# Patient Record
Sex: Male | Born: 1985 | Race: Black or African American | Hispanic: No | Marital: Single | Smoking: Current every day smoker
Health system: Southern US, Community
[De-identification: ages and names within clinical notes are randomized; demographics above are authoritative.]

## PROBLEM LIST (undated history)

## (undated) DIAGNOSIS — G5600 Carpal tunnel syndrome, unspecified upper limb: Secondary | ICD-10-CM

## (undated) DIAGNOSIS — I319 Disease of pericardium, unspecified: Secondary | ICD-10-CM

## (undated) HISTORY — PX: TONSILLECTOMY: SUR1361

## (undated) HISTORY — PX: HERNIA REPAIR: SHX51

---
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
## 2007-03-30 ENCOUNTER — Emergency Department (HOSPITAL_COMMUNITY): Admission: EM | Admit: 2007-03-30 | Discharge: 2007-03-30 | Payer: Self-pay | Admitting: Emergency Medicine

## 2007-06-08 ENCOUNTER — Emergency Department (HOSPITAL_COMMUNITY): Admission: EM | Admit: 2007-06-08 | Discharge: 2007-06-08 | Payer: Self-pay | Admitting: Emergency Medicine

## 2008-05-12 ENCOUNTER — Emergency Department (HOSPITAL_COMMUNITY): Admission: EM | Admit: 2008-05-12 | Discharge: 2008-05-12 | Payer: Self-pay | Admitting: Emergency Medicine

## 2010-01-27 ENCOUNTER — Emergency Department (HOSPITAL_COMMUNITY): Admission: EM | Admit: 2010-01-27 | Discharge: 2010-01-27 | Payer: Self-pay | Admitting: Emergency Medicine

## 2011-01-23 ENCOUNTER — Emergency Department (HOSPITAL_COMMUNITY)
Admission: EM | Admit: 2011-01-23 | Discharge: 2011-01-23 | Disposition: A | Discharge status: Home / Self Care | Payer: Self-pay | Attending: Emergency Medicine | Admitting: Emergency Medicine

## 2011-01-23 DIAGNOSIS — M65839 Other synovitis and tenosynovitis, unspecified forearm: Secondary | ICD-10-CM | POA: Insufficient documentation

## 2011-01-23 DIAGNOSIS — M79609 Pain in unspecified limb: Secondary | ICD-10-CM | POA: Insufficient documentation

## 2011-01-23 DIAGNOSIS — M25539 Pain in unspecified wrist: Secondary | ICD-10-CM | POA: Insufficient documentation

## 2011-03-26 NOTE — Op Note (Signed)
Maurice Williamson, Maurice Williamson                           ACCOUNT NO.:  [id number]   MEDICAL RECORD NO.:  [id number]                   PATIENT TYPE:  AMB   LOCATION:  DAY                                  FACILITY:  [facility]   PHYSICIAN:  Jerolyn Shin C. Katrinka Blazing, M.D.                DATE OF BIRTH:  November 05, 1986   DATE OF PROCEDURE:  11/12/2003  DATE OF DISCHARGE:                                 OPERATIVE REPORT   PREOPERATIVE DIAGNOSIS:  Umbilical hernia.   POSTOPERATIVE DIAGNOSIS:  Umbilical hernia.   OPERATION/PROCEDURE:  Umbilical hernia repair.   SURGEON:  Dirk Dress. Katrinka Blazing, M.D.   DESCRIPTION OF PROCEDURE:  Under general anesthesia, the patient's abdomen  was prepped and draped in the sterile field.  Infraumbilical curvilinear  incision was made.  The proboscis was dissected circumferentially using  mosquito clamps.  It was excised from the overlying skin.  The excess sac  was excised down to the fascia.  The fascia was closed with running 0  Prolene transversely.  The skin was sutured to the fascia with 2-0 Biosyn.  The incision was closed with 4-0 Dexon.  Local infiltration with 0.0.5%  Marcaine with epinephrine was carried out.  Dressing was applied.  The  patient was awakened from anesthesia uneventfully, transferred to a bed and  taken to the post anesthesia care unit for further monitoring.      ___________________________________________                                            Dirk Dress. Katrinka Blazing, M.D.   LCS/MEDQ  D:  11/12/2003  T:  11/12/2003  Job:  [id number]

## 2011-03-26 NOTE — H&P (Signed)
Maurice Williamson, Maurice Williamson                           ACCOUNT NO.:  [id number]   MEDICAL RECORD NO.:  [id number]                   PATIENT TYPE:  AMB   LOCATION:  DAY                                  FACILITY:  [facility]   PHYSICIAN:  Jerolyn Shin C. Katrinka Blazing, M.D.                DATE OF BIRTH:  21-Aug-1986   DATE OF ADMISSION:  DATE OF DISCHARGE:                                HISTORY & PHYSICAL   HISTORY OF PRESENT ILLNESS:  [age]-year-old male with history of umbilical  hernia since birth.  It has gradually increased in size.  It has also become  more symptomatic.  The patient was referred for treatment back in August.  He was advised to have it repaired over the holidays.  He is scheduled to  have it excised and repaired.   PAST HISTORY:  He has no major medical illness.  He has not had surgery  except for tonsil removal.   MEDICATIONS:  He is not on any chronic medications.   FAMILY HISTORY:  Family history positive for hypertension, heart disease,  diabetes, stroke, depression, cancer.   ALLERGIES:  No known drug allergies.   SOCIAL HISTORY:  He is a high [employer].  He smokes cigarettes.  He  does not drink alcohol or use drugs.   PHYSICAL EXAMINATION:  VITAL SIGNS:  On exam, blood pressure 100/60, pulse  74, respirations 16, weight 164 pounds.  HEENT:  Unremarkable.  NECK:  Neck is supple.  CHEST:  Chest clear to auscultation.  HEART:  Regular rhythm without murmur or gallop.  ABDOMEN:  Abdomen is soft and nontender.  There is a 2-cm umbilical defect  with a significant __________  .  EXTREMITIES:  No cyanosis, clubbing or edema.  NEUROLOGIC:  No focal, motor, sensory or cerebellar deficits.   IMPRESSION:  Umbilical hernia.   PLAN:  Umbilical hernia repair.     ___________________________________________                                         Dirk Dress. Katrinka Blazing, M.D.   LCS/MEDQ  D:  11/11/2003  T:  11/12/2003  Job:  [id number]

## 2011-04-12 ENCOUNTER — Emergency Department (HOSPITAL_COMMUNITY): Payer: Self-pay

## 2011-04-12 ENCOUNTER — Emergency Department (HOSPITAL_COMMUNITY)
Admission: EM | Admit: 2011-04-12 | Discharge: 2011-04-12 | Disposition: A | Discharge status: Home / Self Care | Payer: Self-pay | Attending: Emergency Medicine | Admitting: Emergency Medicine

## 2011-04-12 DIAGNOSIS — W268XXA Contact with other sharp object(s), not elsewhere classified, initial encounter: Secondary | ICD-10-CM | POA: Insufficient documentation

## 2011-04-12 DIAGNOSIS — S61409A Unspecified open wound of unspecified hand, initial encounter: Secondary | ICD-10-CM | POA: Insufficient documentation

## 2011-04-12 DIAGNOSIS — F17200 Nicotine dependence, unspecified, uncomplicated: Secondary | ICD-10-CM | POA: Insufficient documentation

## 2011-08-23 LAB — URINALYSIS, ROUTINE W REFLEX MICROSCOPIC
Glucose, UA: NEGATIVE
Nitrite: NEGATIVE
Specific Gravity, Urine: 1.015
pH: 7.5

## 2011-08-23 LAB — GC/CHLAMYDIA PROBE AMP, GENITAL: Chlamydia, DNA Probe: NEGATIVE

## 2011-10-15 ENCOUNTER — Emergency Department (HOSPITAL_COMMUNITY)
Admission: EM | Admit: 2011-10-15 | Discharge: 2011-10-15 | Disposition: A | Discharge status: Home / Self Care | Payer: Self-pay | Attending: Emergency Medicine | Admitting: Emergency Medicine

## 2011-10-15 ENCOUNTER — Encounter: Payer: Self-pay | Admitting: *Deleted

## 2011-10-15 DIAGNOSIS — F17200 Nicotine dependence, unspecified, uncomplicated: Secondary | ICD-10-CM | POA: Insufficient documentation

## 2011-10-15 DIAGNOSIS — M65839 Other synovitis and tenosynovitis, unspecified forearm: Secondary | ICD-10-CM | POA: Insufficient documentation

## 2011-10-15 DIAGNOSIS — M779 Enthesopathy, unspecified: Secondary | ICD-10-CM

## 2011-10-15 MED ORDER — NAPROXEN 250 MG PO TABS
500.0000 mg | ORAL_TABLET | Freq: Once | ORAL | Status: AC
  Administered 2011-10-15: 500 mg via ORAL
  Filled 2011-10-15: qty 2

## 2011-10-15 MED ORDER — OXYCODONE-ACETAMINOPHEN 5-325 MG PO TABS
1.0000 | ORAL_TABLET | Freq: Once | ORAL | Status: AC
  Administered 2011-10-15: 1 via ORAL
  Filled 2011-10-15: qty 1

## 2011-10-15 MED ORDER — HYDROCODONE-ACETAMINOPHEN 5-325 MG PO TABS: ORAL_TABLET | ORAL | Status: AC

## 2011-10-15 MED ORDER — DICLOFENAC SODIUM 75 MG PO TBEC: 75.0000 mg | DELAYED_RELEASE_TABLET | Freq: Two times a day (BID) | ORAL | Status: AC

## 2011-10-15 NOTE — ED Notes (Addendum)
Pt presents with left hand pain that radiates up to left arm. Pt denies injury. Pt states he was diagnosed with tendonitis in same hand.

## 2011-10-15 NOTE — ED Notes (Signed)
Pt a/ox4. resp even and unlabored. NAD at this time. D/C instructions reviewed with pt. Pt verbalized understanding. Pt ambulated to lobby with steady gate.  

## 2011-10-15 NOTE — ED Notes (Signed)
Pt c/o bilateral arm and hand pain;

## 2011-10-18 NOTE — ED Provider Notes (Signed)
History     CSN: [id number] Arrival date & time: 10/15/2011  5:19 PM   First MD Initiated Contact with Patient 10/15/11 1735      Chief Complaint  Patient presents with  . Hand Pain    (Consider location/radiation/quality/duration/timing/severity/associated sxs/prior treatment) HPI Comments: Patient c/o pain, numbness and tingling intermittently to bilateral hands with left> right.  States he performs repetitive movements and grips equipment all day at work.  Has intermittent swelling of the left hand.  PAtient is left hand dominant.  He denies neck pain or stiffness.  Also denies redness or open wounds.   Patient is a 25 y.o. male presenting with hand pain. The history is provided by the patient.  Hand Pain This is a recurrent problem. The current episode started 1 to 4 weeks ago. The problem occurs constantly. The problem has been unchanged. Associated symptoms include arthralgias, joint swelling and numbness. Pertinent negatives include no abdominal pain, chest pain, chills, fever, headaches, myalgias, nausea, neck pain, rash, swollen glands, vomiting or weakness. Associated symptoms comments: Tingling to both hands, left > right. Exacerbated by: excessive use and movement. He has tried NSAIDs for the symptoms. The treatment provided moderate relief.    History reviewed. No pertinent past medical history.  Past Surgical History  Procedure Date  . Hernia repair     History reviewed. No pertinent family history.  History  Substance Use Topics  . Smoking status: Current Everyday Smoker -- 0.5 packs/day    Types: Cigarettes  . Smokeless tobacco: Not on file  . Alcohol Use: Yes     2-3 drinks per week      Review of Systems  Constitutional: Negative for fever and chills.  HENT: Negative for trouble swallowing, neck pain and neck stiffness.   Respiratory: Negative for shortness of breath.   Cardiovascular: Negative for chest pain.  Gastrointestinal: Negative for nausea,  vomiting, abdominal pain and blood in stool.  Musculoskeletal: Positive for joint swelling and arthralgias. Negative for myalgias and back pain.  Skin: Negative.  Negative for rash.  Neurological: Positive for numbness. Negative for dizziness, weakness and headaches.  Hematological: Negative for adenopathy. Does not bruise/bleed easily.  All other systems reviewed and are negative.    Allergies  Review of patient's allergies indicates no known allergies.  Home Medications   Current Outpatient Rx  Name Route Sig Dispense Refill  . IBUPROFEN 200 MG PO TABS Oral Take 200 mg by mouth as needed. For pain     . DICLOFENAC SODIUM 75 MG PO TBEC Oral Take 1 tablet (75 mg total) by mouth 2 (two) times daily. Take with food 20 tablet 0  . HYDROCODONE-ACETAMINOPHEN 5-325 MG PO TABS  Take one-two tabs po q 4-6 hrs prn pain 20 tablet 0    BP 131/72  Pulse 64  Temp(Src) 98.3 F (36.8 C) (Oral)  Resp 17  SpO2 100%  Physical Exam  Nursing note and vitals reviewed. Constitutional: He is oriented to person, place, and time. He appears well-developed and well-nourished. No distress.  HENT:  Head: Normocephalic and atraumatic.  Neck: Normal range of motion. Neck supple.  Cardiovascular: Normal rate, regular rhythm and normal heart sounds.   Pulmonary/Chest: Effort normal and breath sounds normal. No respiratory distress. He exhibits no tenderness.  Musculoskeletal: Normal range of motion. He exhibits edema and tenderness.       Left hand: He exhibits tenderness and swelling. He exhibits normal range of motion, no bony tenderness, normal two-point discrimination, normal capillary refill,  no deformity and no laceration. decreased sensation noted. Decreased sensation is present in the medial distribution. Normal strength noted.       Hands:      Mild ttp along the thenar eminence and distal wrist on left  Lymphadenopathy:    He has no cervical adenopathy.  Neurological: He is alert and oriented to  person, place, and time. No cranial nerve deficit or sensory deficit. He exhibits normal muscle tone. Coordination normal.  Reflex Scores:      Tricep reflexes are 2+ on the right side and 2+ on the left side.      Bicep reflexes are 2+ on the right side and 2+ on the left side.      Brachioradialis reflexes are 2+ on the right side and 2+ on the left side. Skin: Skin is warm and dry.    ED Course  SPLINT APPLICATION Date/Time: 10/15/2011 7:15 PM Performed by: Trisha Mangle, Daiwik Buffalo L. Authorized by: Maxwell Caul Consent: Verbal consent obtained. Consent given by: patient Patient understanding: patient states understanding of the procedure being performed Patient consent: the patient's understanding of the procedure matches consent given Imaging studies: imaging studies available Patient identity confirmed: verbally with patient Time out: Immediately prior to procedure a "time out" was called to verify the correct patient, procedure, equipment, support staff and site/side marked as required. Location details: left wrist Splint type: velcro wrist splint. Post-procedure: The splinted body part was neurovascularly unchanged following the procedure. Patient tolerance: Patient tolerated the procedure well with no immediate complications.   (including critical care time)  Labs Reviewed - No data to display No results found.   1. Tendonitis       MDM    i have explained to the pt that sx's may also be related to tendonitis vs carpal tunnel syndrome.  Will try wrist brace and he agrees to f/u with orthopedics if sx's are persisting.  CR< 2sec, radial pulse is brisk.  Sensation intact although dimished along the median nerve distribution        Neila Teem L. [place], [name] 10/18/11 1648

## 2011-10-22 NOTE — ED Provider Notes (Signed)
Evaluation and management procedures were performed by the PA/NP under my supervision/collaboration.    Seleen Walter D Sheikh Leverich, MD 10/22/11 1136 

## 2012-11-28 ENCOUNTER — Encounter (HOSPITAL_COMMUNITY): Payer: Self-pay | Admitting: Emergency Medicine

## 2012-11-28 ENCOUNTER — Emergency Department (HOSPITAL_COMMUNITY)
Admission: EM | Admit: 2012-11-28 | Discharge: 2012-11-28 | Disposition: A | Discharge status: Home / Self Care | Payer: Self-pay | Attending: Emergency Medicine | Admitting: Emergency Medicine

## 2012-11-28 ENCOUNTER — Emergency Department (HOSPITAL_COMMUNITY): Payer: Self-pay

## 2012-11-28 DIAGNOSIS — J019 Acute sinusitis, unspecified: Secondary | ICD-10-CM | POA: Insufficient documentation

## 2012-11-28 DIAGNOSIS — K0889 Other specified disorders of teeth and supporting structures: Secondary | ICD-10-CM

## 2012-11-28 DIAGNOSIS — F17200 Nicotine dependence, unspecified, uncomplicated: Secondary | ICD-10-CM | POA: Insufficient documentation

## 2012-11-28 DIAGNOSIS — K029 Dental caries, unspecified: Secondary | ICD-10-CM | POA: Insufficient documentation

## 2012-11-28 DIAGNOSIS — R51 Headache: Secondary | ICD-10-CM | POA: Insufficient documentation

## 2012-11-28 DIAGNOSIS — J329 Chronic sinusitis, unspecified: Secondary | ICD-10-CM

## 2012-11-28 DIAGNOSIS — R110 Nausea: Secondary | ICD-10-CM | POA: Insufficient documentation

## 2012-11-28 MED ORDER — HYDROCODONE-ACETAMINOPHEN 5-325 MG PO TABS: 2.0000 | ORAL_TABLET | Freq: Four times a day (QID) | ORAL | Status: DC | PRN

## 2012-11-28 MED ORDER — PENICILLIN V POTASSIUM 500 MG PO TABS: 500.0000 mg | ORAL_TABLET | Freq: Three times a day (TID) | ORAL | Status: DC

## 2012-11-28 MED ORDER — HYDROCODONE-ACETAMINOPHEN 5-325 MG PO TABS
2.0000 | ORAL_TABLET | Freq: Once | ORAL | Status: AC
  Administered 2012-11-28: 2 via ORAL
  Filled 2012-11-28: qty 2

## 2012-11-28 NOTE — ED Notes (Signed)
Patient complaining of dental pain x 2 days, headaches for months.

## 2012-11-28 NOTE — ED Notes (Signed)
Patient requesting something for pain before discharge. Per patient has ride home. EDP made aware, verbal order given.

## 2012-11-28 NOTE — ED Provider Notes (Signed)
History     CSN: [id number]  Arrival date & time 11/28/12  4098   First MD Initiated Contact with Patient 11/28/12 [phone]      Chief Complaint  Patient presents with  . Headache  . Dental Pain    (Consider location/radiation/quality/duration/timing/severity/associated sxs/prior treatment) HPI Comments: Patient presents here with complaints of dental pain for the past several days.  He has a bad rear upper molar.  No fevers or chills.  Also complains of having severe headaches for the past month, believes he has a brain aneurysm.  No injury or trauma.  Denies visual changes.  Feels nauseated but no vomiting.  Taking advil with no relief.  The history is provided by the patient.    History reviewed. No pertinent past medical history.  Past Surgical History  Procedure Date  . Hernia repair   . Tonsillectomy     History reviewed. No pertinent family history.  History  Substance Use Topics  . Smoking status: Current Every Day Smoker -- 0.5 packs/day    Types: Cigarettes  . Smokeless tobacco: Not on file  . Alcohol Use: Yes     Comment: 2-3 drinks per week      Review of Systems  HENT:       Dental pain   Neurological: Positive for headaches.  All other systems reviewed and are negative.    Allergies  Review of patient's allergies indicates no known allergies.  Home Medications   Current Outpatient Rx  Name  Route  Sig  Dispense  Refill  . IBUPROFEN 200 MG PO TABS   Oral   Take 200 mg by mouth as needed. For pain            BP 143/71  Pulse 94  Temp 97.7 F (36.5 C) (Oral)  Resp 16  SpO2 99%  Physical Exam  Nursing note and vitals reviewed. Constitutional: He is oriented to person, place, and time. He appears well-developed and well-nourished. No distress.  HENT:  Head: Normocephalic and atraumatic.  Mouth/Throat: Oropharynx is clear and moist.       There is a decayed left upper rear molar that is severely decayed with a missing piece of enamel.   Eyes: EOM are normal. Pupils are equal, round, and reactive to light.  Neck: Normal range of motion. Neck supple.  Cardiovascular: Normal rate and regular rhythm.   No murmur heard. Pulmonary/Chest: Effort normal and breath sounds normal. No respiratory distress.  Abdominal: Soft. Bowel sounds are normal.  Musculoskeletal: Normal range of motion.  Lymphadenopathy:    He has no cervical adenopathy.  Neurological: He is alert and oriented to person, place, and time. No cranial nerve deficit. He exhibits normal muscle tone. Coordination normal.  Skin: Skin is warm and dry. He is not diaphoretic.    ED Course  Procedures (including critical care time)  Labs Reviewed - No data to display No results found.   No diagnosis found.    MDM  The patient presents here with headache and dental pain.  He has had the headache for months and is concerned he has an aneurysm.  The ct does not reflect this.  The dental pain/caries will be treated with pen vk and norco.        Geoffery Lyons, MD 11/28/12 [phone]

## 2014-01-09 ENCOUNTER — Encounter (HOSPITAL_COMMUNITY): Payer: Self-pay | Admitting: Emergency Medicine

## 2014-01-09 ENCOUNTER — Emergency Department (HOSPITAL_COMMUNITY)
Admission: EM | Admit: 2014-01-09 | Discharge: 2014-01-09 | Disposition: A | Discharge status: Home / Self Care | Payer: Self-pay | Attending: Emergency Medicine | Admitting: Emergency Medicine

## 2014-01-09 DIAGNOSIS — Z792 Long term (current) use of antibiotics: Secondary | ICD-10-CM | POA: Insufficient documentation

## 2014-01-09 DIAGNOSIS — Z87440 Personal history of urinary (tract) infections: Secondary | ICD-10-CM | POA: Insufficient documentation

## 2014-01-09 DIAGNOSIS — X500XXA Overexertion from strenuous movement or load, initial encounter: Secondary | ICD-10-CM | POA: Insufficient documentation

## 2014-01-09 DIAGNOSIS — Y9389 Activity, other specified: Secondary | ICD-10-CM | POA: Insufficient documentation

## 2014-01-09 DIAGNOSIS — N342 Other urethritis: Secondary | ICD-10-CM | POA: Insufficient documentation

## 2014-01-09 DIAGNOSIS — S8990XA Unspecified injury of unspecified lower leg, initial encounter: Secondary | ICD-10-CM | POA: Insufficient documentation

## 2014-01-09 DIAGNOSIS — S99929A Unspecified injury of unspecified foot, initial encounter: Principal | ICD-10-CM

## 2014-01-09 DIAGNOSIS — S99919A Unspecified injury of unspecified ankle, initial encounter: Principal | ICD-10-CM

## 2014-01-09 DIAGNOSIS — Y9289 Other specified places as the place of occurrence of the external cause: Secondary | ICD-10-CM | POA: Insufficient documentation

## 2014-01-09 DIAGNOSIS — T1490XA Injury, unspecified, initial encounter: Secondary | ICD-10-CM

## 2014-01-09 DIAGNOSIS — IMO0002 Reserved for concepts with insufficient information to code with codable children: Secondary | ICD-10-CM | POA: Insufficient documentation

## 2014-01-09 DIAGNOSIS — F17200 Nicotine dependence, unspecified, uncomplicated: Secondary | ICD-10-CM | POA: Insufficient documentation

## 2014-01-09 LAB — URINALYSIS, ROUTINE W REFLEX MICROSCOPIC
GLUCOSE, UA: NEGATIVE mg/dL
Hgb urine dipstick: NEGATIVE
KETONES UR: 15 mg/dL — AB
LEUKOCYTES UA: NEGATIVE
NITRITE: NEGATIVE
PH: 6.5 (ref 5.0–8.0)
Protein, ur: NEGATIVE mg/dL
SPECIFIC GRAVITY, URINE: 1.025 (ref 1.005–1.030)
Urobilinogen, UA: 1 mg/dL (ref 0.0–1.0)

## 2014-01-09 MED ORDER — IBUPROFEN 800 MG PO TABS: 800.0000 mg | ORAL_TABLET | Freq: Three times a day (TID) | ORAL | Status: DC

## 2014-01-09 MED ORDER — AZITHROMYCIN 250 MG PO TABS
1000.0000 mg | ORAL_TABLET | Freq: Once | ORAL | Status: AC
  Administered 2014-01-09: 1000 mg via ORAL
  Filled 2014-01-09: qty 4

## 2014-01-09 MED ORDER — LIDOCAINE HCL (PF) 1 % IJ SOLN
INTRAMUSCULAR | Status: AC
  Administered 2014-01-09: 22:00:00
  Filled 2014-01-09: qty 5

## 2014-01-09 MED ORDER — HYDROCODONE-ACETAMINOPHEN 5-325 MG PO TABS: ORAL_TABLET | ORAL | Status: DC

## 2014-01-09 MED ORDER — IBUPROFEN 800 MG PO TABS
800.0000 mg | ORAL_TABLET | Freq: Once | ORAL | Status: AC
  Administered 2014-01-09: 800 mg via ORAL
  Filled 2014-01-09: qty 1

## 2014-01-09 MED ORDER — CEFTRIAXONE SODIUM 250 MG IJ SOLR
250.0000 mg | Freq: Once | INTRAMUSCULAR | Status: AC
  Administered 2014-01-09: 250 mg via INTRAMUSCULAR
  Filled 2014-01-09: qty 250

## 2014-01-09 MED ORDER — OXYCODONE-ACETAMINOPHEN 5-325 MG PO TABS
1.0000 | ORAL_TABLET | Freq: Once | ORAL | Status: AC
  Administered 2014-01-09: 1 via ORAL
  Filled 2014-01-09: qty 1

## 2014-01-09 NOTE — Discharge Instructions (Signed)
Muscle Strain A muscle strain (pulled muscle) happens when a muscle is stretched beyond normal length. It happens when a sudden, violent force stretches your muscle too far. Usually, a few of the fibers in your muscle are torn. Muscle strain is common in athletes. Recovery usually takes 1 2 weeks. Complete healing takes 5 6 weeks.  HOME CARE   Follow the PRICE method of treatment to help your injury get better. Do this the first 2 3 days after the injury:  Protect. Protect the muscle to keep it from getting injured again.  Rest. Limit your activity and rest the injured body part.  Ice. Put ice in a plastic bag. Place a towel between your skin and the bag. Then, apply the ice and leave it on from 15 20 minutes each hour. After the third day, switch to moist heat packs.  Compression. Use a splint or elastic bandage on the injured area for comfort. Do not put it on too tightly.  Elevate. Keep the injured body part above the level of your heart.  Only take medicine as told by your doctor.  Warm up before doing exercise to prevent future muscle strains. GET HELP IF:   You have more pain or puffiness (swelling) in the injured area.  You feel numbness, tingling, or notice a loss of strength in the injured area. MAKE SURE YOU:   Understand these instructions.  Will watch your condition.  Will get help right away if you are not doing well or get worse. Document Released: 08/03/2008 Document Revised: 08/15/2013 Document Reviewed: 05/24/2013 [facility] Patient Information 2014 [place], [place].  Urethritis, Adult Urethritis is an inflammation of the tube through which urine exits your bladder (urethra).  CAUSES Urethritis is often caused by an infection in your urethra. The infection can be viral, like herpes. The infection can also be bacterial, like gonorrhea. RISK FACTORS Risk factors of urethritis include:  Having sex without using a condom.  Having multiple sexual  partners.  Having poor hygiene. SIGNS AND SYMPTOMS Symptoms of urethritis are less noticeable in women than in men. These symptoms include:  Burning feeling when you urinate (dysuria).  Discharge from your urethra.  Blood in your urine (hematuria).  Urinating more than usual. DIAGNOSIS  To confirm a diagnosis of urethritis, your health care provider will do the following:  Ask about your sexual history.  Perform a physical exam.  Have you provide a sample of your urine for lab testing.  Use a cotton swab to gently collect a sample from your urethra for lab testing. TREATMENT  It is important to treat urethritis. Depending on the cause, untreated urethritis may lead to serious genital infections and possibly infertility. Urethritis caused by a bacterial infection is treated with antibiotics. All sexual partners must be treated.  HOME CARE INSTRUCTIONS  Do not have sex until the test results are known and treatment is completed, even if your symptoms go away before you finish treatment.  Finish all medicines that you are prescribed. SEEK MEDICAL CARE IF:   Your symptoms are not improved in 3 days.  Your symptoms are getting worse.  You develop abdominal pain or pelvic pain (in women).  You develop joint pain. SEEK IMMEDIATE MEDICAL CARE IF:   You have a fever with a temperature of 101.100F (38.8C) or greater.  You have severe pain in the belly, back, or side.  You have repeated vomiting. Document Released: 04/20/2001 Document Revised: 08/15/2013 Document Reviewed: 06/25/2013 [facility] Patient Information 2014 [place].

## 2014-01-09 NOTE — ED Provider Notes (Signed)
CSN: [id number]     Arrival date & time 01/09/14  1955 History   First MD Initiated Contact with Patient 01/09/14 2029     Chief Complaint  Patient presents with  . Leg Pain     (Consider location/radiation/quality/duration/timing/severity/associated sxs/prior Treatment) HPI Comments: Maurice Williamson is a 28 y.o. male who presents to the Emergency Department complaining of right posterior leg back.  States the pain began suddenly after trying to lift a lawnmower from the back of a pick up truck.   Patient is a 28 y.o. male presenting with leg pain. The history is provided by the patient.  Leg Pain Location:  Leg Injury: yes   Mechanism of injury comment:  Bending and lifting Leg location:  R upper leg Pain details:    Quality:  Aching and throbbing   Radiates to:  Back   Severity:  Moderate   Onset quality:  Sudden   Timing:  Constant   Progression:  Unchanged Chronicity:  New Dislocation: no   Prior injury to area:  No Relieved by:  Rest Worsened by:  Bearing weight Ineffective treatments:  None tried Associated symptoms: back pain   Associated symptoms: no decreased ROM, no fatigue, no fever, no neck pain, no numbness, no stiffness, no swelling and no tingling    Patient also c/o dark colored urine, burning with urination and occasional penile discharge.  Patient reports hx of previous UTI but denies previous STD's.  No abdominal pain, fever, vomiting or chills.   History reviewed. No pertinent past medical history. Past Surgical History  Procedure Laterality Date  . Hernia repair    . Tonsillectomy     History reviewed. No pertinent family history. History  Substance Use Topics  . Smoking status: Current Every Day Smoker -- 0.50 packs/day    Types: Cigarettes  . Smokeless tobacco: Not on file  . Alcohol Use: Yes     Comment: 2-3 drinks per week    Review of Systems  Constitutional: Negative for fever, chills and fatigue.  Respiratory: Negative for chest  tightness.   Cardiovascular: Negative for chest pain.  Gastrointestinal: Negative for nausea, vomiting and abdominal pain.  Genitourinary: Positive for dysuria and discharge. Negative for urgency, frequency, hematuria, flank pain, decreased urine volume, penile swelling, scrotal swelling, difficulty urinating, genital sores and testicular pain.  Musculoskeletal: Positive for back pain and myalgias. Negative for arthralgias, joint swelling, neck pain and stiffness.  Skin: Negative for color change, rash and wound.  Neurological: Negative for dizziness, weakness and numbness.  All other systems reviewed and are negative.      Allergies  Review of patient's allergies indicates no known allergies.  Home Medications   Current Outpatient Rx  Name  Route  Sig  Dispense  Refill  . HYDROcodone-acetaminophen (NORCO/VICODIN) 5-325 MG per tablet   Oral   Take 2 tablets by mouth every 6 (six) hours as needed for pain.   15 tablet   0   . HYDROcodone-acetaminophen (NORCO/VICODIN) 5-325 MG per tablet      Take one-two tabs po q 4-6 hrs prn pain   20 tablet   0   . ibuprofen (ADVIL,MOTRIN) 200 MG tablet   Oral   Take 200 mg by mouth as needed. For pain          . ibuprofen (ADVIL,MOTRIN) 800 MG tablet   Oral   Take 1 tablet (800 mg total) by mouth 3 (three) times daily.   21 tablet   0   .  penicillin v potassium (VEETID) 500 MG tablet   Oral   Take 1 tablet (500 mg total) by mouth 3 (three) times daily.   30 tablet   0    BP 140/76  Pulse 77  Temp(Src) 98 F (36.7 C) (Oral)  Resp 20  Wt 175 lb (79.379 kg)  SpO2 100% Physical Exam  Nursing note and vitals reviewed. Constitutional: He is oriented to person, place, and time. He appears well-developed and well-nourished. No distress.  HENT:  Head: Normocephalic and atraumatic.  Neck: Normal range of motion. Neck supple.  Cardiovascular: Normal rate, regular rhythm, normal heart sounds and intact distal pulses.   No murmur  heard. Pulmonary/Chest: Effort normal and breath sounds normal. No respiratory distress.  Abdominal: Soft. He exhibits no distension and no mass. There is no tenderness. There is no rebound and no guarding.  Genitourinary: Testes normal. Cremasteric reflex is present. Circumcised. No phimosis, paraphimosis, hypospadias, penile erythema or penile tenderness. Discharge found.  Musculoskeletal: He exhibits tenderness. He exhibits no edema.       Right upper leg: He exhibits tenderness. He exhibits no bony tenderness, no swelling, no edema, no deformity and no laceration.       Legs:   ttp of the posterior right upper leg and buttock.  No erythema, effusion, or bony deformity. Hamstring appears intact.   Pt has full ROM of the right knee and hip.  DP pulse brisk, distal sensation intact. Calf is soft and NT.  Lymphadenopathy:    He has no cervical adenopathy.       Right: No inguinal adenopathy present.       Left: No inguinal adenopathy present.  Neurological: He is alert and oriented to person, place, and time. He exhibits normal muscle tone. Coordination normal.  Skin: Skin is warm and dry. No erythema.    ED Course  Procedures (including critical care time) Labs Review Labs Reviewed  URINALYSIS, ROUTINE W REFLEX MICROSCOPIC - Abnormal; Notable for the following:    Bilirubin Urine SMALL (*)    Ketones, ur 15 (*)    All other components within normal limits  GC/CHLAMYDIA PROBE AMP   Imaging Review No results found.   EKG Interpretation None      MDM   Final diagnoses:  Muscle injury  Urethritis    GC and Chlamydia culture pending   Patient with localized ttp of the posterior thigh.  No bony or muscle deformity.  NV intact.  Patient ambulates with limp on right .  Likely muscular injury.    Crutches given.  Pt agrees to symptomatic treatment with ice, ibuprofen and short course of vicodin. Referral given to Dr. Aline Brochure.  Dysuria and hx of penile discharge.  Likely  urethritis.  Will treat with zithromax and rocephin   The patient appears reasonably screened and/or stabilized for discharge and I doubt any other medical condition or other [facility] requiring further screening, evaluation, or treatment in the ED at this time prior to discharge.    Ethylene Reznick L. Vanessa Murray, PA-C 01/11/14 1753

## 2014-01-09 NOTE — ED Notes (Signed)
Called lab to check on status of urine, was told "we're putting it in right now"

## 2014-01-09 NOTE — ED Notes (Signed)
Pt verbalized understanding to not to drive within 4 hours of taking Vicodin due to med may cause drowsiness

## 2014-01-09 NOTE — ED Notes (Signed)
Patient complaining of right leg pain. States "I was helping someone take a lawnmower off the truck and felt something pull in my leg today."

## 2014-01-09 NOTE — ED Notes (Signed)
T. Triplett, PA at bedside. 

## 2014-01-11 LAB — GC/CHLAMYDIA PROBE AMP
CT Probe RNA: NEGATIVE
GC PROBE AMP APTIMA: NEGATIVE

## 2014-01-14 NOTE — ED Provider Notes (Signed)
Medical screening examination/treatment/procedure(s) were performed by non-physician practitioner and as supervising physician I was immediately available for consultation/collaboration.   EKG Interpretation None     ]   Tiffiny Worthy L Hussam Muniz, MD 01/14/14 0715 

## 2014-01-21 ENCOUNTER — Telehealth (HOSPITAL_BASED_OUTPATIENT_CLINIC_OR_DEPARTMENT_OTHER): Payer: Self-pay

## 2014-07-25 ENCOUNTER — Emergency Department (HOSPITAL_COMMUNITY)
Admission: EM | Admit: 2014-07-25 | Discharge: 2014-07-25 | Disposition: A | Discharge status: Home / Self Care | Payer: Self-pay | Attending: Emergency Medicine | Admitting: Emergency Medicine

## 2014-07-25 ENCOUNTER — Encounter (HOSPITAL_COMMUNITY): Payer: Self-pay | Admitting: Emergency Medicine

## 2014-07-25 DIAGNOSIS — G5600 Carpal tunnel syndrome, unspecified upper limb: Secondary | ICD-10-CM | POA: Insufficient documentation

## 2014-07-25 DIAGNOSIS — Z202 Contact with and (suspected) exposure to infections with a predominantly sexual mode of transmission: Secondary | ICD-10-CM

## 2014-07-25 DIAGNOSIS — M79609 Pain in unspecified limb: Secondary | ICD-10-CM | POA: Insufficient documentation

## 2014-07-25 DIAGNOSIS — G5601 Carpal tunnel syndrome, right upper limb: Secondary | ICD-10-CM

## 2014-07-25 DIAGNOSIS — Z791 Long term (current) use of non-steroidal anti-inflammatories (NSAID): Secondary | ICD-10-CM | POA: Insufficient documentation

## 2014-07-25 DIAGNOSIS — Z792 Long term (current) use of antibiotics: Secondary | ICD-10-CM | POA: Insufficient documentation

## 2014-07-25 DIAGNOSIS — F17200 Nicotine dependence, unspecified, uncomplicated: Secondary | ICD-10-CM | POA: Insufficient documentation

## 2014-07-25 HISTORY — DX: Carpal tunnel syndrome, unspecified upper limb: G56.00

## 2014-07-25 MED ORDER — IBUPROFEN 800 MG PO TABS
800.0000 mg | ORAL_TABLET | Freq: Once | ORAL | Status: AC
  Administered 2014-07-25: 800 mg via ORAL
  Filled 2014-07-25: qty 1

## 2014-07-25 MED ORDER — METRONIDAZOLE 500 MG PO TABS
2000.0000 mg | ORAL_TABLET | Freq: Once | ORAL | Status: AC
  Administered 2014-07-25: 2000 mg via ORAL
  Filled 2014-07-25: qty 4

## 2014-07-25 NOTE — ED Notes (Signed)
Pt c/o right arm for 2 weeks and has tried Ibuprofen at home; pt also states that his girlfriend was dx last night with trichomonas and needs to be checked due to pt and girlfriend have had unprotected sex

## 2014-07-25 NOTE — ED Notes (Signed)
Pain  Rt arm , hx of carpal tunnel,  Tingling to fingers. No known injury.

## 2014-07-25 NOTE — Discharge Instructions (Signed)
If you were given medicines take as directed.  If you are on coumadin or contraceptives realize their levels and effectiveness is altered by many different medicines.  If you have any reaction (rash, tongues swelling, other) to the medicines stop taking and see a physician.   Wear splint at night as discussed. Minimize use for the next few days. Motrin and tylenol for pain.  Please follow up as directed and return to the ER or see a physician for new or worsening symptoms.  Thank you. Filed Vitals:   07/25/14 1229  BP: 115/56  Pulse: 65  Temp: 99.1 F (37.3 C)  TempSrc: Oral  Resp: 18  Height: 5\' 5"  (1.651 m)  Weight: 157 lb (71.215 kg)  SpO2: 99%
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
## 2015-12-31 ENCOUNTER — Emergency Department (HOSPITAL_COMMUNITY): Payer: Self-pay

## 2015-12-31 ENCOUNTER — Encounter (HOSPITAL_COMMUNITY): Payer: Self-pay | Admitting: Emergency Medicine

## 2015-12-31 ENCOUNTER — Emergency Department (HOSPITAL_COMMUNITY)
Admission: EM | Admit: 2015-12-31 | Discharge: 2015-12-31 | Disposition: A | Discharge status: Home / Self Care | Payer: Self-pay | Attending: Emergency Medicine | Admitting: Emergency Medicine

## 2015-12-31 DIAGNOSIS — R05 Cough: Secondary | ICD-10-CM | POA: Insufficient documentation

## 2015-12-31 DIAGNOSIS — M791 Myalgia: Secondary | ICD-10-CM | POA: Insufficient documentation

## 2015-12-31 DIAGNOSIS — Z113 Encounter for screening for infections with a predominantly sexual mode of transmission: Secondary | ICD-10-CM | POA: Insufficient documentation

## 2015-12-31 DIAGNOSIS — F17210 Nicotine dependence, cigarettes, uncomplicated: Secondary | ICD-10-CM | POA: Insufficient documentation

## 2015-12-31 DIAGNOSIS — J111 Influenza due to unidentified influenza virus with other respiratory manifestations: Secondary | ICD-10-CM | POA: Insufficient documentation

## 2015-12-31 DIAGNOSIS — R6889 Other general symptoms and signs: Secondary | ICD-10-CM

## 2015-12-31 LAB — URINALYSIS, ROUTINE W REFLEX MICROSCOPIC
Bilirubin Urine: NEGATIVE
Glucose, UA: NEGATIVE mg/dL
HGB URINE DIPSTICK: NEGATIVE
Ketones, ur: NEGATIVE mg/dL
Leukocytes, UA: NEGATIVE
Nitrite: NEGATIVE
Protein, ur: NEGATIVE mg/dL
SPECIFIC GRAVITY, URINE: 1.025 (ref 1.005–1.030)
pH: 6 (ref 5.0–8.0)

## 2015-12-31 MED ORDER — AZITHROMYCIN 250 MG PO TABS
1000.0000 mg | ORAL_TABLET | Freq: Once | ORAL | Status: AC
  Administered 2015-12-31: 1000 mg via ORAL
  Filled 2015-12-31: qty 4

## 2015-12-31 MED ORDER — LIDOCAINE HCL (PF) 1 % IJ SOLN
INTRAMUSCULAR | Status: AC
  Administered 2015-12-31: 1 mL
  Filled 2015-12-31: qty 5

## 2015-12-31 MED ORDER — OSELTAMIVIR PHOSPHATE 75 MG PO CAPS: 75.0000 mg | ORAL_CAPSULE | Freq: Two times a day (BID) | ORAL | Status: DC

## 2015-12-31 MED ORDER — CEFTRIAXONE SODIUM 250 MG IJ SOLR
250.0000 mg | Freq: Once | INTRAMUSCULAR | Status: AC
  Administered 2015-12-31: 250 mg via INTRAMUSCULAR
  Filled 2015-12-31: qty 250

## 2015-12-31 NOTE — ED Notes (Signed)
Pt states understanding of care given and follow up instructions.  Ambulated from the ED

## 2015-12-31 NOTE — Discharge Instructions (Signed)
You may alternate between Tylenol 1000 mg every 6 hours as needed for fever and pain and ibuprofen 800 mg every 8 hours as needed for fever and pain. Both of these medications are found over-the-counter. I recommended she rest and increase her fluid intake. Your chest x-ray showed no pneumonia. This may be influenza. We are discharging you with a prescription for Tamiflu which if you decide to take you must start this medication tomorrow otherwise you are outside the treatment window for this medication to be effective.   Your urine showed no sign of infection. We have checked you for gonorrhea, chlamydia, HIV and syphilis. If these tests are positive, you will be contacted and your partner will need to be tested and treated as well. We have empirically covered you with antibiotics for gonorrhea and Chlamydia. I recommend no sexual intercourse (vaginal, rectal, oral) until you have received your test results.   Upper Respiratory Infection, Adult Most upper respiratory infections (URIs) are a viral infection of the air passages leading to the lungs. A URI affects the nose, throat, and upper air passages. The most common type of URI is nasopharyngitis and is typically referred to as "the common cold." URIs run their course and usually go away on their own. Most of the time, a URI does not require medical attention, but sometimes a bacterial infection in the upper airways can follow a viral infection. This is called a secondary infection. Sinus and middle ear infections are common types of secondary upper respiratory infections. Bacterial pneumonia can also complicate a URI. A URI can worsen asthma and chronic obstructive pulmonary disease (COPD). Sometimes, these complications can require emergency medical care and may be life threatening.  CAUSES Almost all URIs are caused by viruses. A virus is a type of germ and can spread from one person to another.  RISKS FACTORS You may be at risk for a URI if:    You smoke.   You have chronic heart or lung disease.  You have a weakened defense (immune) system.   You are very young or very old.   You have nasal allergies or asthma.  You work in crowded or poorly ventilated areas.  You work in health care facilities or schools. SIGNS AND SYMPTOMS  Symptoms typically develop 2-3 days after you come in contact with a cold virus. Most viral URIs last 7-10 days. However, viral URIs from the influenza virus (flu virus) can last 14-18 days and are typically more severe. Symptoms may include:   Runny or stuffy (congested) nose.   Sneezing.   Cough.   Sore throat.   Headache.   Fatigue.   Fever.   Loss of appetite.   Pain in your forehead, behind your eyes, and over your cheekbones (sinus pain).  Muscle aches.  DIAGNOSIS  Your health care provider may diagnose a URI by:  Physical exam.  Tests to check that your symptoms are not due to another condition such as:  Strep throat.  Sinusitis.  Pneumonia.  Asthma. TREATMENT  A URI goes away on its own with time. It cannot be cured with medicines, but medicines may be prescribed or recommended to relieve symptoms. Medicines may help:  Reduce your fever.  Reduce your cough.  Relieve nasal congestion. HOME CARE INSTRUCTIONS   Take medicines only as directed by your health care provider.   Gargle warm saltwater or take cough drops to comfort your throat as directed by your health care provider.  Use a warm mist humidifier  or inhale steam from a shower to increase air moisture. This may make it easier to breathe.  Drink enough fluid to keep your urine clear or pale yellow.   Eat soups and other clear broths and maintain good nutrition.   Rest as needed.   Return to work when your temperature has returned to normal or as your health care provider advises. You may need to stay home longer to avoid infecting others. You can also use a face mask and careful  hand washing to prevent spread of the virus.  Increase the usage of your inhaler if you have asthma.   Do not use any tobacco products, including cigarettes, chewing tobacco, or electronic cigarettes. If you need help quitting, ask your health care provider. PREVENTION  The best way to protect yourself from getting a cold is to practice good hygiene.   Avoid oral or hand contact with people with cold symptoms.   Wash your hands often if contact occurs.  There is no clear evidence that vitamin C, vitamin E, echinacea, or exercise reduces the chance of developing a cold. However, it is always recommended to get plenty of rest, exercise, and practice good nutrition.  SEEK MEDICAL CARE IF:   You are getting worse rather than better.   Your symptoms are not controlled by medicine.   You have chills.  You have worsening shortness of breath.  You have brown or red mucus.  You have yellow or brown nasal discharge.  You have pain in your face, especially when you bend forward.  You have a fever.  You have swollen neck glands.  You have pain while swallowing.  You have white areas in the back of your throat. SEEK IMMEDIATE MEDICAL CARE IF:   You have severe or persistent:  Headache.  Ear pain.  Sinus pain.  Chest pain.  You have chronic lung disease and any of the following:  Wheezing.  Prolonged cough.  Coughing up blood.  A change in your usual mucus.  You have a stiff neck.  You have changes in your:  Vision.  Hearing.  Thinking.  Mood. MAKE SURE YOU:   Understand these instructions.  Will watch your condition.  Will get help right away if you are not doing well or get worse.   This information is not intended to replace advice given to you by your health care provider. Make sure you discuss any questions you have with your health care provider.   Document Released: 04/20/2001 Document Revised: 03/11/2015 Document Reviewed:  01/30/2014 Elsevier Interactive Patient Education [employer].

## 2015-12-31 NOTE — ED Notes (Signed)
Patient complaining of cough, nasal congestion, and body aches x 2-3 days.

## 2015-12-31 NOTE — ED Provider Notes (Signed)
TIME SEEN: 2:50 AM  CHIEF COMPLAINT: Chills, cough, nasal congestion, body aches; requesting STD check  HPI: Pt is a 30 y.o. male with no significant past medical history who presents emergency department 2 separate complaints. He reports 2 days of chills, nonproductive cough, nasal congestion, sore throat and bodyaches. Did not have an influenza vaccination this year. No known sick contacts. No fevers. No vomiting, diarrhea. No rash. No recent travel.   Patient also asking for STD screening. States he has had previous STDs that have been treated. Is sexually active with one partner and does not use protection always. States he is concerned that his warfarin recently cheated on him. States he has had some mild dysuria but no penile discharge. No testicular pain or swelling. No abdominal pain.  ROS: See HPI Constitutional: no fever  Eyes: no drainage  ENT:  runny nose   Cardiovascular:  no chest pain  Resp: no SOB  GI: no vomiting GU: dysuria Integumentary: no rash  Allergy: no hives  Musculoskeletal: no leg swelling  Neurological: no slurred speech ROS otherwise negative  PAST MEDICAL HISTORY/PAST SURGICAL HISTORY:  Past Medical History  Diagnosis Date  . Carpal tunnel syndrome     MEDICATIONS:  Prior to Admission medications   Medication Sig Start Date End Date Taking? Authorizing Provider  HYDROcodone-acetaminophen (NORCO/VICODIN) 5-325 MG per tablet Take 2 tablets by mouth every 6 (six) hours as needed for pain. 11/28/12   Veryl Speak, MD  HYDROcodone-acetaminophen (NORCO/VICODIN) 5-325 MG per tablet Take one-two tabs po q 4-6 hrs prn pain 01/09/14   Tammy Triplett, PA-C  ibuprofen (ADVIL,MOTRIN) 200 MG tablet Take 200 mg by mouth as needed. For pain     Historical Provider, MD  ibuprofen (ADVIL,MOTRIN) 800 MG tablet Take 1 tablet (800 mg total) by mouth 3 (three) times daily. 01/09/14   Tammy Triplett, PA-C  penicillin v potassium (VEETID) 500 MG tablet Take 1 tablet (500 mg  total) by mouth 3 (three) times daily. 11/28/12   Veryl Speak, MD    ALLERGIES:  No Known Allergies  SOCIAL HISTORY:  Social History  Substance Use Topics  . Smoking status: Current Every Day Smoker -- 0.50 packs/day    Types: Cigarettes  . Smokeless tobacco: Not on file  . Alcohol Use: Yes     Comment: occasionally    FAMILY HISTORY: History reviewed. No pertinent family history.  EXAM: BP 115/65 mmHg  Pulse 71  Temp(Src) 97.9 F (36.6 C) (Oral)  Resp 16  Ht 5\' 6"  (1.676 m)  Wt 157 lb (71.215 kg)  BMI 25.35 kg/m2  SpO2 98% CONSTITUTIONAL: Alert and oriented and responds appropriately to questions. Well-appearing; well-nourished, afebrile and nontoxic HEAD: Normocephalic EYES: Conjunctivae clear, PERRL ENT: normal nose; no rhinorrhea; moist mucous membranes; pharynx without lesions noted; no tonsillar hypertrophy or exudate, no uvular deviation, no trismus or drooling, normal phonation, no stridor, no dental caries or abscess noted, no Ludwig's angina, tongue sits flat in the bottom of the mouth NECK: Supple, no meningismus, no LAD  CARD: RRR; S1 and S2 appreciated; no murmurs, no clicks, no rubs, no gallops RESP: Normal chest excursion without splinting or tachypnea; breath sounds clear and equal bilaterally; no wheezes, no rhonchi, no rales, no hypoxia or respiratory distress, speaking full sentences ABD/GI: Normal bowel sounds; non-distended; soft, non-tender, no rebound, no guarding, no peritoneal signs GU:  Normal external genitalia, circumcised male, normal penile shaft, no blood or discharge at the urethral meatus, no testicular masses or tenderness on exam,  no scrotal masses or swelling, no hernias appreciated, 2+ femoral pulses bilaterally; no perineal erythema, warmth, subcutaneous air or crepitus; no high riding testicle, normal bilateral cremasteric reflex BACK:  The back appears normal and is non-tender to palpation, there is no CVA tenderness EXT: Normal ROM in  all joints; non-tender to palpation; no edema; normal capillary refill; no cyanosis, no calf tenderness or swelling    SKIN: Normal color for age and race; warm NEURO: Moves all extremities equally, sensation to light touch intact diffusely, cranial nerves II through XII intact PSYCH: The patient's mood and manner are appropriate. Grooming and personal hygiene are appropriate.  MEDICAL DECISION MAKING: Patient here with complaints of viral upper respiratory syndrome, possible influenza. Chest x-ray shows no pneumonia. Have offered him Tamiflu which he agrees to. I do not feel he needs flu testing however. Discussed with patient that there may be some side effects with Tamiflu. Patient also concerned for STD exposure. Will treat empirically for gonorrhea and chlamydia. We have tested him for gonorrhea, chlamydia, syphilis and HIV. Discussed with patient that he will be contacted these results were positive and his partners would need to be treated as well. Advised him to avoid sexual intercourse until he has a results of his testing. Urine shows no sign of infection. I feel he is safe to be discharged home. Recommended increasing fluid intake, rest and alternating Tylenol and ibuprofen for fever and pain. We'll discharge with work note. Discussed return precautions. Patient verbalizes understanding and is comfortable with this plan.       [place], DO 12/31/15 [phone]

## 2016-01-01 LAB — GC/CHLAMYDIA PROBE AMP (~~LOC~~) NOT AT ARMC
Chlamydia: NEGATIVE
Neisseria Gonorrhea: NEGATIVE

## 2016-01-01 LAB — HIV ANTIBODY (ROUTINE TESTING W REFLEX): HIV SCREEN 4TH GENERATION: NONREACTIVE

## 2016-01-01 LAB — RPR: RPR: NONREACTIVE

## 2016-02-12 ENCOUNTER — Encounter (HOSPITAL_COMMUNITY): Payer: Self-pay | Admitting: *Deleted

## 2016-02-12 ENCOUNTER — Emergency Department (HOSPITAL_COMMUNITY): Payer: BLUE CROSS/BLUE SHIELD

## 2016-02-12 ENCOUNTER — Emergency Department (HOSPITAL_COMMUNITY)
Admission: EM | Admit: 2016-02-12 | Discharge: 2016-02-12 | Disposition: A | Discharge status: Home / Self Care | Payer: BLUE CROSS/BLUE SHIELD | Attending: Emergency Medicine | Admitting: Emergency Medicine

## 2016-02-12 DIAGNOSIS — R51 Headache: Secondary | ICD-10-CM | POA: Diagnosis not present

## 2016-02-12 DIAGNOSIS — Y999 Unspecified external cause status: Secondary | ICD-10-CM | POA: Diagnosis not present

## 2016-02-12 DIAGNOSIS — S39012A Strain of muscle, fascia and tendon of lower back, initial encounter: Secondary | ICD-10-CM | POA: Diagnosis not present

## 2016-02-12 DIAGNOSIS — F17210 Nicotine dependence, cigarettes, uncomplicated: Secondary | ICD-10-CM | POA: Insufficient documentation

## 2016-02-12 DIAGNOSIS — S63615A Unspecified sprain of left ring finger, initial encounter: Secondary | ICD-10-CM | POA: Insufficient documentation

## 2016-02-12 DIAGNOSIS — Y9389 Activity, other specified: Secondary | ICD-10-CM | POA: Diagnosis not present

## 2016-02-12 DIAGNOSIS — W228XXA Striking against or struck by other objects, initial encounter: Secondary | ICD-10-CM | POA: Insufficient documentation

## 2016-02-12 DIAGNOSIS — S63613A Unspecified sprain of left middle finger, initial encounter: Secondary | ICD-10-CM | POA: Diagnosis not present

## 2016-02-12 DIAGNOSIS — Y92488 Other paved roadways as the place of occurrence of the external cause: Secondary | ICD-10-CM | POA: Diagnosis not present

## 2016-02-12 DIAGNOSIS — S3992XA Unspecified injury of lower back, initial encounter: Secondary | ICD-10-CM | POA: Diagnosis present

## 2016-02-12 DIAGNOSIS — S63619A Unspecified sprain of unspecified finger, initial encounter: Secondary | ICD-10-CM

## 2016-02-12 MED ORDER — DICLOFENAC SODIUM 75 MG PO TBEC: 75.0000 mg | DELAYED_RELEASE_TABLET | Freq: Two times a day (BID) | ORAL | Status: DC

## 2016-02-12 MED ORDER — METHOCARBAMOL 500 MG PO TABS: 500.0000 mg | ORAL_TABLET | Freq: Three times a day (TID) | ORAL | Status: DC
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
## 2016-04-15 ENCOUNTER — Encounter (HOSPITAL_COMMUNITY): Payer: Self-pay

## 2016-04-15 ENCOUNTER — Emergency Department (HOSPITAL_COMMUNITY): Payer: BLUE CROSS/BLUE SHIELD

## 2016-04-15 ENCOUNTER — Emergency Department (HOSPITAL_COMMUNITY)
Admission: EM | Admit: 2016-04-15 | Discharge: 2016-04-15 | Disposition: A | Discharge status: Home / Self Care | Payer: BLUE CROSS/BLUE SHIELD | Attending: Emergency Medicine | Admitting: Emergency Medicine

## 2016-04-15 DIAGNOSIS — F17210 Nicotine dependence, cigarettes, uncomplicated: Secondary | ICD-10-CM | POA: Insufficient documentation

## 2016-04-15 DIAGNOSIS — Y999 Unspecified external cause status: Secondary | ICD-10-CM | POA: Diagnosis not present

## 2016-04-15 DIAGNOSIS — S40011A Contusion of right shoulder, initial encounter: Secondary | ICD-10-CM | POA: Insufficient documentation

## 2016-04-15 DIAGNOSIS — Y939 Activity, unspecified: Secondary | ICD-10-CM | POA: Diagnosis not present

## 2016-04-15 DIAGNOSIS — J069 Acute upper respiratory infection, unspecified: Secondary | ICD-10-CM

## 2016-04-15 DIAGNOSIS — A64 Unspecified sexually transmitted disease: Secondary | ICD-10-CM | POA: Diagnosis not present

## 2016-04-15 DIAGNOSIS — Y929 Unspecified place or not applicable: Secondary | ICD-10-CM | POA: Diagnosis not present

## 2016-04-15 DIAGNOSIS — M25511 Pain in right shoulder: Secondary | ICD-10-CM | POA: Diagnosis present

## 2016-04-15 DIAGNOSIS — S40021A Contusion of right upper arm, initial encounter: Secondary | ICD-10-CM

## 2016-04-15 DIAGNOSIS — IMO0001 Reserved for inherently not codable concepts without codable children: Secondary | ICD-10-CM

## 2016-04-15 MED ORDER — CEFTRIAXONE SODIUM 250 MG IJ SOLR
250.0000 mg | Freq: Once | INTRAMUSCULAR | Status: AC
  Administered 2016-04-15: 250 mg via INTRAMUSCULAR
  Filled 2016-04-15: qty 250

## 2016-04-15 MED ORDER — BENZONATATE 100 MG PO CAPS
200.0000 mg | ORAL_CAPSULE | Freq: Once | ORAL | Status: AC
  Administered 2016-04-15: 200 mg via ORAL
  Filled 2016-04-15: qty 2

## 2016-04-15 MED ORDER — LIDOCAINE HCL (PF) 1 % IJ SOLN
INTRAMUSCULAR | Status: AC
  Administered 2016-04-15: 5 mL
  Filled 2016-04-15: qty 5

## 2016-04-15 MED ORDER — IBUPROFEN 800 MG PO TABS: 800.0000 mg | ORAL_TABLET | Freq: Three times a day (TID) | ORAL | Status: DC

## 2016-04-15 MED ORDER — IBUPROFEN 800 MG PO TABS
800.0000 mg | ORAL_TABLET | Freq: Once | ORAL | Status: AC
  Administered 2016-04-15: 800 mg via ORAL
  Filled 2016-04-15: qty 1

## 2016-04-15 MED ORDER — BENZONATATE 100 MG PO CAPS: 100.0000 mg | ORAL_CAPSULE | Freq: Three times a day (TID) | ORAL | Status: DC

## 2016-04-15 MED ORDER — AZITHROMYCIN 250 MG PO TABS
1000.0000 mg | ORAL_TABLET | Freq: Once | ORAL | Status: AC
  Administered 2016-04-15: 1000 mg via ORAL
  Filled 2016-04-15: qty 4

## 2016-04-15 NOTE — Discharge Instructions (Signed)
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
## 2016-04-15 NOTE — ED Notes (Signed)
Pt states he took Nyquil PTA, pt is drowsy but responds to all questions appropriately.

## 2016-04-19 LAB — GC/CHLAMYDIA PROBE AMP (~~LOC~~) NOT AT ARMC
CHLAMYDIA, DNA PROBE: NEGATIVE
Neisseria Gonorrhea: POSITIVE — AB

## 2016-04-20 ENCOUNTER — Telehealth (HOSPITAL_BASED_OUTPATIENT_CLINIC_OR_DEPARTMENT_OTHER): Payer: Self-pay | Admitting: Emergency Medicine

## 2016-12-30 ENCOUNTER — Encounter (HOSPITAL_COMMUNITY): Payer: Self-pay | Admitting: *Deleted

## 2016-12-30 ENCOUNTER — Emergency Department (HOSPITAL_COMMUNITY): Payer: BLUE CROSS/BLUE SHIELD

## 2016-12-30 ENCOUNTER — Emergency Department (HOSPITAL_COMMUNITY)
Admission: EM | Admit: 2016-12-30 | Discharge: 2016-12-31 | Disposition: A | Discharge status: Home / Self Care | Payer: BLUE CROSS/BLUE SHIELD | Attending: Emergency Medicine | Admitting: Emergency Medicine

## 2016-12-30 DIAGNOSIS — Y9389 Activity, other specified: Secondary | ICD-10-CM | POA: Diagnosis not present

## 2016-12-30 DIAGNOSIS — F17210 Nicotine dependence, cigarettes, uncomplicated: Secondary | ICD-10-CM | POA: Insufficient documentation

## 2016-12-30 DIAGNOSIS — Y929 Unspecified place or not applicable: Secondary | ICD-10-CM | POA: Diagnosis not present

## 2016-12-30 DIAGNOSIS — S20211A Contusion of right front wall of thorax, initial encounter: Secondary | ICD-10-CM | POA: Insufficient documentation

## 2016-12-30 DIAGNOSIS — R519 Headache, unspecified: Secondary | ICD-10-CM

## 2016-12-30 DIAGNOSIS — Y998 Other external cause status: Secondary | ICD-10-CM | POA: Diagnosis not present

## 2016-12-30 DIAGNOSIS — W108XXA Fall (on) (from) other stairs and steps, initial encounter: Secondary | ICD-10-CM | POA: Insufficient documentation

## 2016-12-30 DIAGNOSIS — R51 Headache: Secondary | ICD-10-CM | POA: Insufficient documentation

## 2016-12-30 DIAGNOSIS — S299XXA Unspecified injury of thorax, initial encounter: Secondary | ICD-10-CM | POA: Diagnosis present

## 2016-12-30 MED ORDER — DIPHENHYDRAMINE HCL 50 MG/ML IJ SOLN
25.0000 mg | Freq: Once | INTRAMUSCULAR | Status: AC
  Administered 2016-12-30: 25 mg via INTRAVENOUS
  Filled 2016-12-30: qty 1

## 2016-12-30 MED ORDER — KETOROLAC TROMETHAMINE 30 MG/ML IJ SOLN
30.0000 mg | Freq: Once | INTRAMUSCULAR | Status: AC
  Administered 2016-12-30: 30 mg via INTRAVENOUS
  Filled 2016-12-30: qty 1

## 2016-12-30 MED ORDER — METOCLOPRAMIDE HCL 5 MG/ML IJ SOLN
10.0000 mg | Freq: Once | INTRAMUSCULAR | Status: AC
  Administered 2016-12-30: 10 mg via INTRAVENOUS
  Filled 2016-12-30: qty 2

## 2016-12-30 NOTE — ED Triage Notes (Signed)
Pt c/o falling down 5 stairs yesterday landing on his back and striking the back of his head; pt states he is also having some chest pain that has been going on for the last few weeks

## 2016-12-30 NOTE — ED Provider Notes (Signed)
[facility] DEPT Provider Note   CSN: [id number] Arrival date & time: 12/30/16  2058     History   Chief Complaint Chief Complaint  Patient presents with  . Fall    HPI Maurice Williamson is a 31 y.o. male.  HPI   Maurice Williamson is a 31 y.o. male who presents to the Emergency Department complaining of diffuse frontal headache for 2 days and right rib pain for one day.  He describes the headache as a throbbing sensation behind his eyes and across his forehead.  Headache is similar to previous.  He also complains of light sensitivity.  He states that he was helping someone move a heavy object when he slipped and fell against a shelf, striking his right ribs and right upper back and shoulder.  He complains of right shoulder and upper right back pain with movement.  Pain improves at rest.  He denies actual fall, LOC, neck pain, visual loss, vomiting and shortness of breath.   Past Medical History:  Diagnosis Date  . Carpal tunnel syndrome     There are no active problems to display for this patient.   Past Surgical History:  Procedure Laterality Date  . HERNIA REPAIR    . TONSILLECTOMY         Home Medications    Prior to Admission medications   Medication Sig Start Date End Date Taking? Authorizing Provider  acetaminophen (TYLENOL) 500 MG tablet Take 500 mg by mouth every 6 (six) hours as needed for mild pain or moderate pain.   Yes Historical Provider, MD    Family History History reviewed. No pertinent family history.  Social History Social History  Substance Use Topics  . Smoking status: Current Every Day Smoker    Packs/day: 0.25    Types: Cigarettes  . Smokeless tobacco: Never Used  . Alcohol use Yes     Comment: occasionally     Allergies   Patient has no known allergies.   Review of Systems Review of Systems  Constitutional: Negative for activity change, appetite change and fever.  HENT: Negative for facial swelling and trouble swallowing.     Eyes: Positive for photophobia. Negative for pain and visual disturbance.  Respiratory: Negative for shortness of breath.   Cardiovascular: Negative for chest pain (right rib pain).  Gastrointestinal: Negative for abdominal pain, nausea and vomiting.  Musculoskeletal: Negative for neck pain and neck stiffness.  Skin: Negative for rash and wound.  Neurological: Positive for headaches. Negative for dizziness, syncope, facial asymmetry, speech difficulty, weakness and numbness.  Psychiatric/Behavioral: Negative for confusion and decreased concentration.  All other systems reviewed and are negative.    Physical Exam Updated Vital Signs BP 126/71 (BP Location: Left Arm)   Pulse 82   Temp 98.5 F (36.9 C) (Oral)   Resp 18   Ht 5\' 8"  (1.727 m)   Wt 81.6 kg   SpO2 97%   BMI 27.37 kg/m   Physical Exam  Constitutional: He is oriented to person, place, and time. He appears well-developed and well-nourished. No distress.  HENT:  Head: Normocephalic and atraumatic.  Mouth/Throat: Oropharynx is clear and moist.  Eyes: Conjunctivae and EOM are normal. Pupils are equal, round, and reactive to light.  Neck: Normal range of motion and phonation normal. Neck supple. Muscular tenderness present. No spinous process tenderness present. No neck rigidity. No Brudzinski's sign and no Kernig's sign noted.    Tenderness along the right SCM and trapezius muscles.   Cardiovascular: Normal  rate, regular rhythm, normal heart sounds and intact distal pulses.   No murmur heard. Pulmonary/Chest: Effort normal and breath sounds normal. No respiratory distress. He exhibits tenderness (mild tenderenss to palpation of the lateral right upper ribs.  No bruising, abrasions or crepitus on exam.).  Abdominal: Soft. He exhibits no distension. There is no tenderness. There is no guarding.  Musculoskeletal: Normal range of motion.  Neurological: He is alert and oriented to person, place, and time. He has normal  strength. No cranial nerve deficit or sensory deficit. He exhibits normal muscle tone. Coordination and gait normal. GCS eye subscore is 4. GCS verbal subscore is 5. GCS motor subscore is 6.  Reflex Scores:      Tricep reflexes are 2+ on the right side and 2+ on the left side.      Bicep reflexes are 2+ on the right side and 2+ on the left side. Skin: Skin is warm and dry.  Psychiatric: He has a normal mood and affect.  Nursing note and vitals reviewed.    ED Treatments / Results  Labs (all labs ordered are listed, but only abnormal results are displayed) Labs Reviewed - No data to display  EKG  ED ECG REPORT   Date: 12/31/2016  EKG Time: 12:16 AM  Rate: 76  Rhythm: normal sinus rhythm and sinus arrhythmia,    Axis: normal  Intervals:none  ST&T Change: none  Narrative Interpretation: early repolarization, nml EKG   EKG reviewed by Dr. Roderic Palau        Radiology Dg Ribs Unilateral W/chest Right  Result Date: 12/30/2016 CLINICAL DATA:  Initial evaluation for acute trauma, fell down stairs. Acute posterior chest pain. EXAM: RIGHT RIBS AND CHEST - 3+ VIEW COMPARISON:  Prior radiograph from 04/15/2016. FINDINGS: Cardiac and mediastinal silhouettes are stable in size and contour, and remain within normal limits. Lungs are normally inflated. No focal infiltrates. No pulmonary edema or pleural effusions. No pneumothorax. Metallic BB marker overlies the lower right chest at site of pain. No acute displaced rib fracture identified. No other acute osseous abnormality. IMPRESSION: 1. No acute displaced rib fracture identified. 2. No acute cardiopulmonary disease. Electronically Signed   By: Jeannine Boga M.D.   On: 12/30/2016 23:12   Dg Shoulder Right  Result Date: 12/30/2016 CLINICAL DATA:  Fall down stairs EXAM: RIGHT SHOULDER - 2+ VIEW COMPARISON:  Right shoulder radiograph 04/15/2016 FINDINGS: There is no evidence of fracture or dislocation. There is no evidence of arthropathy or  other focal bone abnormality. Soft tissues are unremarkable. IMPRESSION: No fracture or dislocation of the right shoulder. Electronically Signed   By: Ulyses Jarred M.D.   On: 12/30/2016 23:12    Procedures Procedures (including critical care time)  Medications Ordered in ED Medications  ketorolac (TORADOL) 30 MG/ML injection 30 mg (30 mg Intravenous Given 12/30/16 2221)  diphenhydrAMINE (BENADRYL) injection 25 mg (25 mg Intravenous Given 12/30/16 2221)  metoCLOPramide (REGLAN) injection 10 mg (10 mg Intravenous Given 12/30/16 2221)     Initial Impression / Assessment and Plan / ED Course  I have reviewed the triage vital signs and the nursing notes.  Pertinent labs & imaging results that were available during my care of the patient were reviewed by me and considered in my medical decision making (see chart for details).     2320  On recheck, pt is resting comfortably.  Headache improved after IV medications.  Pt well appearing. Headache gradual in onset and doubt intercranial process. No nuchal rigidity, no focal  neuro deficits. XR neg for fx.    Pt appears stable for d/c, return precautions discussed.  Pt agrees to plan.  Final Clinical Impressions(s) / ED Diagnoses   Final diagnoses:  Contusion of rib on right side, initial encounter  Acute nonintractable headache, unspecified headache type    New Prescriptions New Prescriptions   No medications on file     Bufford Lope 01/01/17 1217    Milton Ferguson, MD 01/01/17 [phone]

## 2016-12-31 NOTE — Discharge Instructions (Signed)
Apply ice packs on/off to your upper back and chest.  Avoid heavy lifting with the right arm for 4-5 days.  Return here for any worsening symptoms such as worsening headache, visual changes and vomiting

## 2017-01-05 ENCOUNTER — Emergency Department (HOSPITAL_COMMUNITY)
Admission: EM | Admit: 2017-01-05 | Discharge: 2017-01-05 | Disposition: A | Discharge status: Home / Self Care | Payer: BLUE CROSS/BLUE SHIELD | Attending: Emergency Medicine | Admitting: Emergency Medicine

## 2017-01-05 ENCOUNTER — Emergency Department (HOSPITAL_COMMUNITY): Payer: BLUE CROSS/BLUE SHIELD

## 2017-01-05 ENCOUNTER — Encounter (HOSPITAL_COMMUNITY): Payer: Self-pay | Admitting: *Deleted

## 2017-01-05 DIAGNOSIS — M5412 Radiculopathy, cervical region: Secondary | ICD-10-CM | POA: Insufficient documentation

## 2017-01-05 DIAGNOSIS — M62830 Muscle spasm of back: Secondary | ICD-10-CM | POA: Diagnosis not present

## 2017-01-05 DIAGNOSIS — M542 Cervicalgia: Secondary | ICD-10-CM | POA: Diagnosis present

## 2017-01-05 DIAGNOSIS — F17210 Nicotine dependence, cigarettes, uncomplicated: Secondary | ICD-10-CM | POA: Diagnosis not present

## 2017-01-05 MED ORDER — CYCLOBENZAPRINE HCL 10 MG PO TABS: 10.0000 mg | ORAL_TABLET | Freq: Two times a day (BID) | ORAL | 0 refills | Status: DC | PRN

## 2017-01-05 MED ORDER — PREDNISONE 20 MG PO TABS: 40.0000 mg | ORAL_TABLET | Freq: Every day | ORAL | 0 refills | Status: DC

## 2017-01-05 NOTE — ED Provider Notes (Signed)
[facility] [facility] DEPT Provider Note   CSN: [id number] Arrival date & time: 01/05/17  1957     History   Chief Complaint Chief Complaint  Patient presents with  . Back Pain    HPI Maurice Williamson is a 31 y.o. male.  Patient presents to the emergency department with chief complaint of neck and upper back pain that radiates to his right arm. He states that he feels some tingling sensation that runs down into his third, fourth, and fifth fingers. He states that the symptoms started after slipping and falling almost a week ago. Also complains of some left flank pain. He has not taken anything today for his symptoms. He states that he has neck and upper back pain is worsened with palpation. He reports associated mild headache, but denies any fevers or chills. No other associated symptoms.   The history is provided by the patient. No language interpreter was used.    Past Medical History:  Diagnosis Date  . Carpal tunnel syndrome     There are no active problems to display for this patient.   Past Surgical History:  Procedure Laterality Date  . HERNIA REPAIR    . TONSILLECTOMY         Home Medications    Prior to Admission medications   Medication Sig Start Date End Date Taking? Authorizing Provider  acetaminophen (TYLENOL) 500 MG tablet Take 500 mg by mouth every 6 (six) hours as needed for mild pain or moderate pain.    Historical Provider, MD  cyclobenzaprine (FLEXERIL) 10 MG tablet Take 1 tablet (10 mg total) by mouth 2 (two) times daily as needed for muscle spasms. 01/05/17   Montine Circle, PA-C  predniSONE (DELTASONE) 20 MG tablet Take 2 tablets (40 mg total) by mouth daily. Take 40 mg by mouth daily for 3 days, then 20mg  by mouth daily for 3 days, then 10mg  daily for 3 days 01/05/17   Montine Circle, PA-C    Family History History reviewed. No pertinent family history.  Social History Social History  Substance Use Topics  . Smoking status: Current Every Day Smoker     Packs/day: 0.25    Types: Cigarettes  . Smokeless tobacco: Never Used  . Alcohol use Yes     Comment: occasionally     Allergies   Patient has no known allergies.   Review of Systems Review of Systems  Musculoskeletal: Positive for myalgias and neck pain.  Neurological: Negative for weakness.  All other systems reviewed and are negative.    Physical Exam Updated Vital Signs BP 121/79 (BP Location: Right Arm)   Pulse 78   Temp 98.2 F (36.8 C) (Oral)   Resp 18   Ht 5\' 8"  (1.727 m)   Wt 81.6 kg   SpO2 99%   BMI 27.37 kg/m   Physical Exam  Constitutional: He is oriented to person, place, and time. He appears well-developed and well-nourished.  HENT:  Head: Normocephalic and atraumatic.  Eyes: Conjunctivae and EOM are normal. Pupils are equal, round, and reactive to light. Right eye exhibits no discharge. Left eye exhibits no discharge. No scleral icterus.  Neck: Normal range of motion. Neck supple. No JVD present.  Cardiovascular: Normal rate, regular rhythm and normal heart sounds.  Exam reveals no gallop and no friction rub.   No murmur heard. Pulmonary/Chest: Effort normal and breath sounds normal. No respiratory distress. He has no wheezes. He has no rales. He exhibits no tenderness.  Abdominal: Soft. He exhibits no  distension and no mass. There is no tenderness. There is no rebound and no guarding.  Musculoskeletal: Normal range of motion. He exhibits no edema or tenderness.  Tenderness to palpation of the right upper trapezius and cervical paraspinal muscles  Moves all extremities  Range of motion and strength 5/5 throughout  Neurological: He is alert and oriented to person, place, and time.  Sensation and strength intact  Skin: Skin is warm and dry.  No rashes  Psychiatric: He has a normal mood and affect. His behavior is normal. Judgment and thought content normal.  Nursing note and vitals reviewed.    ED Treatments / Results  Labs (all labs  ordered are listed, but only abnormal results are displayed) Labs Reviewed - No data to display  EKG  EKG Interpretation None       Radiology Dg Chest 2 View  Result Date: 01/05/2017 CLINICAL DATA:  31 y/o  M; left inferior rib pain. EXAM: CHEST  2 VIEW COMPARISON:  12/30/2016 chest radiograph FINDINGS: Stable heart size and mediastinal contours are within normal limits. Both lungs are clear. The visualized skeletal structures are unremarkable. IMPRESSION: No active cardiopulmonary disease. Electronically Signed   By: Kristine Garbe M.D.   On: 01/05/2017 21:07    Procedures Procedures (including critical care time)  Medications Ordered in ED Medications - No data to display   Initial Impression / Assessment and Plan / ED Course  I have reviewed the triage vital signs and the nursing notes.  Pertinent labs & imaging results that were available during my care of the patient were reviewed by me and considered in my medical decision making (see chart for details).     Patient symptoms are consistent with cervical radiculopathy. He is quite tender to palpation in his upper trapezius, question whether he has some muscle spasm. I suspect that he also has related tension headache. Regarding the left side/rib pain, his chest x-ray is negative for acute process. I recommend conservative therapy at this point. Patient understands and agrees with the plan. He is stable and ready for discharge.  Final Clinical Impressions(s) / ED Diagnoses   Final diagnoses:  Muscle spasm of back  Cervical radiculopathy    New Prescriptions Discharge Medication List as of 01/05/2017  9:17 PM    START taking these medications   Details  cyclobenzaprine (FLEXERIL) 10 MG tablet Take 1 tablet (10 mg total) by mouth 2 (two) times daily as needed for muscle spasms., Starting Wed 01/05/2017, Print    predniSONE (DELTASONE) 20 MG tablet Take 2 tablets (40 mg total) by mouth daily. Take 40 mg by mouth  daily for 3 days, then 20mg  by mouth daily for 3 days, then 10mg  daily for 3 days, Starting Wed 01/05/2017, Print         Montine Circle, PA-C 01/05/17 [place], MD 01/06/17 1406

## 2017-01-05 NOTE — ED Triage Notes (Signed)
Pt c/o pain to lower back, left side and pain to right arm; pt states he is still having a headache, pt was seen here last week for same complaint

## 2017-04-21 ENCOUNTER — Emergency Department (HOSPITAL_COMMUNITY): Payer: BLUE CROSS/BLUE SHIELD

## 2017-04-21 ENCOUNTER — Emergency Department (HOSPITAL_COMMUNITY)
Admission: EM | Admit: 2017-04-21 | Discharge: 2017-04-22 | Disposition: A | Discharge status: Home / Self Care | Payer: BLUE CROSS/BLUE SHIELD | Attending: Emergency Medicine | Admitting: Emergency Medicine

## 2017-04-21 ENCOUNTER — Encounter (HOSPITAL_COMMUNITY): Payer: Self-pay | Admitting: Emergency Medicine

## 2017-04-21 DIAGNOSIS — Y9389 Activity, other specified: Secondary | ICD-10-CM | POA: Insufficient documentation

## 2017-04-21 DIAGNOSIS — W500XXA Accidental hit or strike by another person, initial encounter: Secondary | ICD-10-CM | POA: Diagnosis not present

## 2017-04-21 DIAGNOSIS — F17210 Nicotine dependence, cigarettes, uncomplicated: Secondary | ICD-10-CM | POA: Diagnosis not present

## 2017-04-21 DIAGNOSIS — Z791 Long term (current) use of non-steroidal anti-inflammatories (NSAID): Secondary | ICD-10-CM | POA: Insufficient documentation

## 2017-04-21 DIAGNOSIS — M79602 Pain in left arm: Secondary | ICD-10-CM | POA: Diagnosis present

## 2017-04-21 DIAGNOSIS — Y929 Unspecified place or not applicable: Secondary | ICD-10-CM | POA: Diagnosis not present

## 2017-04-21 DIAGNOSIS — Y998 Other external cause status: Secondary | ICD-10-CM | POA: Insufficient documentation

## 2017-04-21 DIAGNOSIS — S022XXA Fracture of nasal bones, initial encounter for closed fracture: Secondary | ICD-10-CM | POA: Insufficient documentation

## 2017-04-21 DIAGNOSIS — R072 Precordial pain: Secondary | ICD-10-CM | POA: Insufficient documentation

## 2017-04-21 DIAGNOSIS — B353 Tinea pedis: Secondary | ICD-10-CM | POA: Diagnosis not present
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
## 2017-04-22 LAB — BASIC METABOLIC PANEL
Anion gap: 7 (ref 5–15)
BUN: 11 mg/dL (ref 6–20)
CALCIUM: 8.7 mg/dL — AB (ref 8.9–10.3)
CO2: 27 mmol/L (ref 22–32)
CREATININE: 0.91 mg/dL (ref 0.61–1.24)
Chloride: 106 mmol/L (ref 101–111)
Glucose, Bld: 96 mg/dL (ref 65–99)
Potassium: 3.5 mmol/L (ref 3.5–5.1)
SODIUM: 140 mmol/L (ref 135–145)

## 2017-04-22 LAB — URINALYSIS, ROUTINE W REFLEX MICROSCOPIC
Bilirubin Urine: NEGATIVE
Glucose, UA: NEGATIVE mg/dL
Hgb urine dipstick: NEGATIVE
KETONES UR: NEGATIVE mg/dL
LEUKOCYTES UA: NEGATIVE
NITRITE: NEGATIVE
PROTEIN: NEGATIVE mg/dL
Specific Gravity, Urine: 1.026 (ref 1.005–1.030)
pH: 6 (ref 5.0–8.0)

## 2017-04-22 LAB — CBC WITH DIFFERENTIAL/PLATELET
BASOS ABS: 0 10*3/uL (ref 0.0–0.1)
BASOS PCT: 1 %
EOS ABS: 0.2 10*3/uL (ref 0.0–0.7)
EOS PCT: 3 %
HCT: 37.6 % — ABNORMAL LOW (ref 39.0–52.0)
HEMOGLOBIN: 12.1 g/dL — AB (ref 13.0–17.0)
LYMPHS ABS: 3.3 10*3/uL (ref 0.7–4.0)
Lymphocytes Relative: 51 %
MCH: 29.2 pg (ref 26.0–34.0)
MCHC: 32.2 g/dL (ref 30.0–36.0)
MCV: 90.8 fL (ref 78.0–100.0)
Monocytes Absolute: 0.6 10*3/uL (ref 0.1–1.0)
Monocytes Relative: 10 %
NEUTROS PCT: 37 %
Neutro Abs: 2.4 10*3/uL (ref 1.7–7.7)
PLATELETS: 223 10*3/uL (ref 150–400)
RBC: 4.14 MIL/uL — AB (ref 4.22–5.81)
RDW: 13.1 % (ref 11.5–15.5)
WBC: 6.6 10*3/uL (ref 4.0–10.5)

## 2017-04-22 LAB — RAPID URINE DRUG SCREEN, HOSP PERFORMED
AMPHETAMINES: NOT DETECTED
BENZODIAZEPINES: NOT DETECTED
Barbiturates: NOT DETECTED
Cocaine: NOT DETECTED
OPIATES: NOT DETECTED
Tetrahydrocannabinol: POSITIVE — AB

## 2017-04-22 LAB — TROPONIN I

## 2017-04-22 MED ORDER — NAPROXEN 500 MG PO TABS: 500.0000 mg | ORAL_TABLET | Freq: Two times a day (BID) | ORAL | 0 refills | Status: DC

## 2017-04-22 MED ORDER — MICONAZOLE NITRATE 2 % EX POWD: CUTANEOUS | 0 refills | Status: DC | PRN

## 2017-04-22 NOTE — Discharge Instructions (Signed)
You may apply ice packs on/off to your chest.  Call the ear, nose and throat doctor listed to arrange a follow-up appt regarding your broken nose and follow-up with your primary doctor if needed

## 2017-04-22 NOTE — ED Notes (Signed)
Pt ambulatory to waiting room. Pt verbalized understanding of discharge instructions.   

## 2017-09-17 ENCOUNTER — Emergency Department (HOSPITAL_COMMUNITY)
Admission: EM | Admit: 2017-09-17 | Discharge: 2017-09-18 | Disposition: A | Discharge status: Home / Self Care | Payer: BLUE CROSS/BLUE SHIELD

## 2017-09-18 NOTE — ED Notes (Signed)
No answer in waiting room 

## 2017-12-31 ENCOUNTER — Emergency Department (HOSPITAL_COMMUNITY): Payer: BLUE CROSS/BLUE SHIELD

## 2017-12-31 ENCOUNTER — Emergency Department (HOSPITAL_COMMUNITY)
Admission: EM | Admit: 2017-12-31 | Discharge: 2017-12-31 | Disposition: A | Discharge status: Home / Self Care | Payer: BLUE CROSS/BLUE SHIELD | Attending: Emergency Medicine | Admitting: Emergency Medicine

## 2017-12-31 ENCOUNTER — Encounter (HOSPITAL_COMMUNITY): Payer: Self-pay | Admitting: *Deleted

## 2017-12-31 DIAGNOSIS — I309 Acute pericarditis, unspecified: Secondary | ICD-10-CM | POA: Diagnosis not present

## 2017-12-31 DIAGNOSIS — F17210 Nicotine dependence, cigarettes, uncomplicated: Secondary | ICD-10-CM | POA: Insufficient documentation

## 2017-12-31 DIAGNOSIS — R079 Chest pain, unspecified: Secondary | ICD-10-CM | POA: Diagnosis present

## 2017-12-31 DIAGNOSIS — Z79899 Other long term (current) drug therapy: Secondary | ICD-10-CM | POA: Diagnosis not present

## 2017-12-31 DIAGNOSIS — I319 Disease of pericardium, unspecified: Secondary | ICD-10-CM

## 2017-12-31 LAB — BASIC METABOLIC PANEL
Anion gap: 9 (ref 5–15)
BUN: 15 mg/dL (ref 6–20)
CHLORIDE: 103 mmol/L (ref 101–111)
CO2: 27 mmol/L (ref 22–32)
Calcium: 9.3 mg/dL (ref 8.9–10.3)
Creatinine, Ser: 1.15 mg/dL (ref 0.61–1.24)
GFR calc Af Amer: >60 mL/min (ref >60)
Glucose, Bld: 64 mg/dL — ABNORMAL LOW (ref 65–99)
POTASSIUM: 3.7 mmol/L (ref 3.5–5.1)
SODIUM: 139 mmol/L (ref 135–145)

## 2017-12-31 LAB — URINALYSIS, ROUTINE W REFLEX MICROSCOPIC
Bilirubin Urine: NEGATIVE
Glucose, UA: NEGATIVE mg/dL
Hgb urine dipstick: NEGATIVE
KETONES UR: NEGATIVE mg/dL
LEUKOCYTES UA: NEGATIVE
Nitrite: NEGATIVE
PROTEIN: NEGATIVE mg/dL
Specific Gravity, Urine: 1.029 (ref 1.005–1.030)
pH: 6 (ref 5.0–8.0)

## 2017-12-31 LAB — CBC WITH DIFFERENTIAL/PLATELET
BASOS PCT: 0 %
Basophils Absolute: 0 10*3/uL (ref 0.0–0.1)
EOS ABS: 0.1 10*3/uL (ref 0.0–0.7)
EOS PCT: 3 %
HCT: 42.5 % (ref 39.0–52.0)
HEMOGLOBIN: 13.6 g/dL (ref 13.0–17.0)
LYMPHS PCT: 47 %
Lymphs Abs: 2.2 10*3/uL (ref 0.7–4.0)
MCH: 29.2 pg (ref 26.0–34.0)
MCHC: 32 g/dL (ref 30.0–36.0)
MCV: 91.4 fL (ref 78.0–100.0)
MONO ABS: 0.7 10*3/uL (ref 0.1–1.0)
Monocytes Relative: 16 %
NEUTROS PCT: 34 %
Neutro Abs: 1.5 10*3/uL — ABNORMAL LOW (ref 1.7–7.7)
PLATELETS: 203 10*3/uL (ref 150–400)
RBC: 4.65 MIL/uL (ref 4.22–5.81)
RDW: 12.9 % (ref 11.5–15.5)
WBC: 4.5 10*3/uL (ref 4.0–10.5)

## 2017-12-31 LAB — TROPONIN I

## 2017-12-31 MED ORDER — IBUPROFEN 800 MG PO TABS: 800.0000 mg | ORAL_TABLET | Freq: Three times a day (TID) | ORAL | 0 refills | Status: DC

## 2017-12-31 MED ORDER — ONDANSETRON HCL 4 MG/2ML IJ SOLN
4.0000 mg | Freq: Once | INTRAMUSCULAR | Status: AC
  Administered 2017-12-31: 4 mg via INTRAVENOUS
  Filled 2017-12-31: qty 2

## 2017-12-31 MED ORDER — DEXAMETHASONE SODIUM PHOSPHATE 10 MG/ML IJ SOLN
10.0000 mg | Freq: Once | INTRAMUSCULAR | Status: AC
  Administered 2017-12-31: 10 mg via INTRAVENOUS
  Filled 2017-12-31: qty 1

## 2017-12-31 MED ORDER — DEXAMETHASONE 4 MG PO TABS: 4.0000 mg | ORAL_TABLET | Freq: Two times a day (BID) | ORAL | 0 refills | Status: DC

## 2017-12-31 MED ORDER — KETOROLAC TROMETHAMINE 30 MG/ML IJ SOLN
30.0000 mg | Freq: Once | INTRAMUSCULAR | Status: AC
  Administered 2017-12-31: 30 mg via INTRAVENOUS
  Filled 2017-12-31: qty 1

## 2017-12-31 MED ORDER — COLCHICINE 0.6 MG PO TABS
1.2000 mg | ORAL_TABLET | Freq: Once | ORAL | Status: AC
  Administered 2017-12-31: 1.2 mg via ORAL
  Filled 2017-12-31: qty 2

## 2017-12-31 NOTE — ED Triage Notes (Signed)
Pt with mid to right chest pain that comes and goes ongoing for a week, denies cough. Sob at times, no distress noted in triage.

## 2017-12-31 NOTE — Discharge Instructions (Signed)
Your test and examination suggest pericarditis.  This is probably viral related.  Please use ibuprofen 3 times daily with a meal.  Please use Decadron 2 times daily with food.  Please call the [facility] to establish a primary physician to follow-up your pericarditis.  Your urine test was negative for kidney stone or infection.  A culture has been sent to the lab to help in your evaluation of your lower abdomen discomfort.  Please return to the emergency department if you are having trouble with your breathing, your chest discomfort is getting worse, you develop vomiting, or changes in your general condition.

## 2017-12-31 NOTE — ED Provider Notes (Signed)
[facility] EMERGENCY DEPARTMENT Provider Note   CSN: [id number] Arrival date & time: 12/31/17  1331     History   Chief Complaint Chief Complaint  Patient presents with  . Chest Pain    HPI Maurice Williamson is a 32 y.o. male.  Patient is a 32 year old male who presents to the emergency department with a complaint of chest pain.  The patient states that he has right-sided chest pain that has been present for 1-2 weeks.  He says it feels as though a stabbing sharp sensation.  At times it causes him to feel short of breath.  He is not been breaking out any sweats.  He is not lost consciousness.  There is been no excessive vomiting to be reported.  Patient denies a history of cardiac related illnesses.  The patient is a smoker, he uses alcohol, he denies the use of any illicit drugs.  He is not had any recent injury to the chest area.  The patient also complains of some left lower abdomen pain that radiates to the penis, he states at times it hurts when he urinates.  He would like to have this evaluated as well.      Past Medical History:  Diagnosis Date  . Carpal tunnel syndrome     There are no active problems to display for this patient.   Past Surgical History:  Procedure Laterality Date  . HERNIA REPAIR    . TONSILLECTOMY         Home Medications    Prior to Admission medications   Medication Sig Start Date End Date Taking? Authorizing Provider  miconazole (LOTRIMIN AF) 2 % powder Apply topically as needed for itching. Apply between the toes 04/22/17   Triplett, Tammy, PA-C  naproxen (NAPROSYN) 500 MG tablet Take 1 tablet (500 mg total) by mouth 2 (two) times daily with a meal. 04/22/17   Triplett, Tammy, PA-C    Family History History reviewed. No pertinent family history.  Social History Social History   Tobacco Use  . Smoking status: Current Every Day Smoker    Packs/day: 0.50    Types: Cigarettes  . Smokeless tobacco: Never Used  Substance Use Topics    . Alcohol use: Yes    Comment: occasionally  . Drug use: No     Allergies   Patient has no known allergies.   Review of Systems Review of Systems  Constitutional: Negative for activity change.       All ROS Neg except as noted in HPI  HENT: Negative for nosebleeds.   Eyes: Negative for photophobia and discharge.  Respiratory: Positive for shortness of breath. Negative for cough and wheezing.   Cardiovascular: Positive for chest pain. Negative for palpitations.  Gastrointestinal: Positive for abdominal pain. Negative for blood in stool.  Genitourinary: Positive for dysuria. Negative for frequency and hematuria.  Musculoskeletal: Negative for arthralgias, back pain and neck pain.  Skin: Negative.   Neurological: Negative for dizziness, seizures and speech difficulty.  Psychiatric/Behavioral: Negative for confusion and hallucinations.     Physical Exam Updated Vital Signs BP 114/78 (BP Location: Right Arm)   Pulse 84   Temp 98.3 F (36.8 C) (Oral)   Resp 16   Ht 5\' 6"  (1.676 m)   Wt 81.6 kg (180 lb)   SpO2 100%   BMI 29.05 kg/m   Physical Exam  Constitutional: He appears well-developed and well-nourished. No distress.  HENT:  Head: Normocephalic and atraumatic.  Right Ear: External ear normal.  Left Ear: External ear normal.  Eyes: Conjunctivae are normal. Right eye exhibits no discharge. Left eye exhibits no discharge. No scleral icterus.  Neck: Neck supple. No tracheal deviation present.  Cardiovascular: Normal rate, regular rhythm, normal heart sounds and intact distal pulses. Exam reveals no gallop, no friction rub and no decreased pulses.  No rub or gallop appreciated.  Pulmonary/Chest: Effort normal and breath sounds normal. No stridor. No respiratory distress. He has no wheezes. He has no rales.  Abdominal: Soft. Bowel sounds are normal. He exhibits no distension. There is tenderness in the suprapubic area and left lower quadrant. There is no rebound and no  guarding.  Musculoskeletal: He exhibits no edema or tenderness.  Neurological: He is alert. He has normal strength. No cranial nerve deficit (no facial droop, extraocular movements intact, no slurred speech) or sensory deficit. He exhibits normal muscle tone. He displays no seizure activity. Coordination normal.  Skin: Skin is warm and dry. No rash noted.  Psychiatric: He has a normal mood and affect.  Nursing note and vitals reviewed.    ED Treatments / Results  Labs (all labs ordered are listed, but only abnormal results are displayed) Labs Reviewed  BASIC METABOLIC PANEL - Abnormal; Notable for the following components:      Result Value   Glucose, Bld 64 (*)    All other components within normal limits  TROPONIN I  CBC WITH DIFFERENTIAL/PLATELET    EKG  EKG Interpretation None       Radiology Dg Chest 2 View  Result Date: 12/31/2017 CLINICAL DATA:  Chest burning sensation today.  Shortness of breath. EXAM: CHEST  2 VIEW COMPARISON:  04/21/2017 FINDINGS: The heart size and mediastinal contours are within normal limits. Both lungs are clear. No pleural effusion or pneumothorax. The visualized skeletal structures are unremarkable. IMPRESSION: No active cardiopulmonary disease. Electronically Signed   By: Lajean Manes M.D.   On: 12/31/2017 14:08    Procedures Procedures (including critical care time)  Medications Ordered in ED Medications - No data to display   Initial Impression / Assessment and Plan / ED Course  I have reviewed the triage vital signs and the nursing notes.  Pertinent labs & imaging results that were available during my care of the patient were reviewed by me and considered in my medical decision making (see chart for details).       Final Clinical Impressions(s) / ED Diagnoses  MDM  Vital signs within normal limits.  Pulse oximetry is 100% on room air.  Electrocardiogram shows a normal sinus rhythm at 83 beats a minute.  There is ST  elevation diffusely suggesting acute pericarditis.  Urine analysis is negative for acute problem.  Complete blood count is well within normal limits. Glucose is slightly low at 64, otherwise the basic metabolic panel is well within normal limits. Troponin is negative for acute event.  Chest x-ray shows no cardiopulmonary disease.  Pt treated in the ED with IV toradol, decadron, and oral colchicine.  Pt feeling some better. Second Troponin pending.  Pt care to be continue by Amada Kingfisher, PA-C.    Final diagnoses:  Pericarditis associated with other disease, unspecified chronicity    ED Discharge Orders        Ordered    ibuprofen (ADVIL,MOTRIN) 800 MG tablet  3 times daily     12/31/17 1716    dexamethasone (DECADRON) 4 MG tablet  2 times daily with meals     12/31/17 1716  Lily Kocher, PA-C 12/31/17 1726    Nat Christen, MD 01/01/18 [phone]

## 2017-12-31 NOTE — ED Provider Notes (Signed)
Received patient at signout from [facility].  Refer to provider note for full history and physical examination.  Briefly, patient is a 32 year old male presenting with a 3-week history of chest pain.  Presentation and EKG consistent with pericarditis.  Initial troponin is negative.  Given colchicine, Decadron, Toradol, and Zofran in the ED with some improvement.  Awaiting second troponin.  If troponin negative, stable for outpatient management of pericarditis.  If elevated troponin, may require admission for further evaluation and workup.   7:30PM Serial troponins are negative.  Patient resting comfortably in no apparent distress.  Stable for discharge home with ibuprofen, Decadron.  He will follow-up with primary care for reevaluation of his symptoms.  He has no complaints prior to discharge and verbalizes understanding of and agreement with plan.    Renita Papa, PA-C 12/31/17 2247    Orlie Dakin, MD 12/31/17 2256

## 2017-12-31 NOTE — ED Notes (Signed)
HB in to assess 

## 2018-01-02 LAB — GC/CHLAMYDIA PROBE AMP (~~LOC~~) NOT AT ARMC
Chlamydia: NEGATIVE
Neisseria Gonorrhea: NEGATIVE

## 2018-01-15 ENCOUNTER — Emergency Department (HOSPITAL_COMMUNITY): Payer: BLUE CROSS/BLUE SHIELD

## 2018-01-15 ENCOUNTER — Emergency Department (HOSPITAL_COMMUNITY)
Admission: EM | Admit: 2018-01-15 | Discharge: 2018-01-15 | Disposition: A | Discharge status: Home / Self Care | Payer: BLUE CROSS/BLUE SHIELD | Attending: Emergency Medicine | Admitting: Emergency Medicine

## 2018-01-15 ENCOUNTER — Encounter (HOSPITAL_COMMUNITY): Payer: Self-pay | Admitting: *Deleted

## 2018-01-15 ENCOUNTER — Other Ambulatory Visit: Payer: Self-pay

## 2018-01-15 DIAGNOSIS — R0789 Other chest pain: Secondary | ICD-10-CM | POA: Diagnosis present

## 2018-01-15 DIAGNOSIS — F17210 Nicotine dependence, cigarettes, uncomplicated: Secondary | ICD-10-CM | POA: Insufficient documentation

## 2018-01-15 LAB — CBC WITH DIFFERENTIAL/PLATELET
Basophils Absolute: 0 10*3/uL (ref 0.0–0.1)
Basophils Relative: 0 %
Eosinophils Absolute: 0.1 10*3/uL (ref 0.0–0.7)
Eosinophils Relative: 2 %
HEMATOCRIT: 40.5 % (ref 39.0–52.0)
Hemoglobin: 12.6 g/dL — ABNORMAL LOW (ref 13.0–17.0)
LYMPHS PCT: 40 %
Lymphs Abs: 2.7 10*3/uL (ref 0.7–4.0)
MCH: 28.8 pg (ref 26.0–34.0)
MCHC: 31.1 g/dL (ref 30.0–36.0)
MCV: 92.5 fL (ref 78.0–100.0)
MONO ABS: 0.7 10*3/uL (ref 0.1–1.0)
Monocytes Relative: 10 %
NEUTROS ABS: 3.1 10*3/uL (ref 1.7–7.7)
Neutrophils Relative %: 48 %
Platelets: 214 10*3/uL (ref 150–400)
RBC: 4.38 MIL/uL (ref 4.22–5.81)
RDW: 13.7 % (ref 11.5–15.5)
WBC: 6.6 10*3/uL (ref 4.0–10.5)

## 2018-01-15 LAB — BASIC METABOLIC PANEL
ANION GAP: 10 (ref 5–15)
BUN: 23 mg/dL — ABNORMAL HIGH (ref 6–20)
CALCIUM: 9.4 mg/dL (ref 8.9–10.3)
CO2: 24 mmol/L (ref 22–32)
CREATININE: 0.94 mg/dL (ref 0.61–1.24)
Chloride: 107 mmol/L (ref 101–111)
GFR calc Af Amer: >60 mL/min (ref >60)
GFR calc non Af Amer: >60 mL/min (ref >60)
GLUCOSE: 105 mg/dL — AB (ref 65–99)
Potassium: 4.1 mmol/L (ref 3.5–5.1)
Sodium: 141 mmol/L (ref 135–145)

## 2018-01-15 MED ORDER — IBUPROFEN 800 MG PO TABS: 800.0000 mg | ORAL_TABLET | Freq: Three times a day (TID) | ORAL | 0 refills | Status: DC | PRN

## 2018-01-15 MED ORDER — IBUPROFEN 800 MG PO TABS
800.0000 mg | ORAL_TABLET | Freq: Once | ORAL | Status: AC
  Administered 2018-01-15: 800 mg via ORAL
  Filled 2018-01-15: qty 1

## 2018-01-15 MED ORDER — IOPAMIDOL (ISOVUE-370) INJECTION 76%
100.0000 mL | Freq: Once | INTRAVENOUS | Status: AC | PRN
  Administered 2018-01-15: 100 mL via INTRAVENOUS

## 2018-01-15 NOTE — Discharge Instructions (Signed)
Call Dr. Nelly Laurence office tomorrow to schedule the next available appointment.  Tell office staff that you were seen here tonight.  They can get copies of all of your test results.  Ask Dr. Harl Bowie or get a primary care physician to help you to stop smoking.  You also need to stop marijuana use.

## 2018-01-15 NOTE — ED Provider Notes (Signed)
[facility] EMERGENCY DEPARTMENT Provider Note   CSN: [id number] Arrival date & time: 01/15/18  2045     History   Chief Complaint Chief Complaint  Patient presents with  . Chest Pain    HPI Maurice Williamson is a 32 y.o. male.  Complains of of sudden onset anterior chest pain rating to the back which felt like "a muscle cramp in my back" onset 1 hour ago.  No longer has back pain.  Presently has vague discomfort is anterior chest that feels similar to "a pill stuck in my throat" which feels similar to pain presented when he came here for evaluation on 12/31/2017 and was diagnosed with pericarditis.  Pain is nonexertional.  He denies any shortness of breath.  Worse with lying supine and improved with sitting upright.  He was treated with colchicine Decadron and Toradol and prescribed dexamethasone and ibuprofen which she is taken with partial relief.  He ran out of dexamethasone a few days ago.  HPI  Past Medical History:  Diagnosis Date  . Carpal tunnel syndrome    Pericarditis diagnosed February 2019 There are no active problems to display for this patient.   Past Surgical History:  Procedure Laterality Date  . HERNIA REPAIR    . TONSILLECTOMY         Home Medications    Prior to Admission medications   Medication Sig Start Date End Date Taking? Authorizing Provider  dexamethasone (DECADRON) 4 MG tablet Take 1 tablet (4 mg total) by mouth 2 (two) times daily with a meal. 12/31/17   Lily Kocher, PA-C  ibuprofen (ADVIL,MOTRIN) 800 MG tablet Take 1 tablet (800 mg total) by mouth 3 (three) times daily. 12/31/17   Lily Kocher, PA-C  miconazole (LOTRIMIN AF) 2 % powder Apply topically as needed for itching. Apply between the toes 04/22/17   Triplett, Tammy, PA-C  naproxen (NAPROSYN) 500 MG tablet Take 1 tablet (500 mg total) by mouth 2 (two) times daily with a meal. 04/22/17   Triplett, Tammy, PA-C    Family History No family history on file. No family history of MI Social  History Social History   Tobacco Use  . Smoking status: Current Every Day Smoker    Packs/day: 0.50    Types: Cigarettes  . Smokeless tobacco: Never Used  Substance Use Topics  . Alcohol use: Yes    Comment: occasionally  . Drug use: No   Positive marijuana use .  Last used marijuana earlier today. Allergies   Patient has no known allergies.   Review of Systems Review of Systems  Constitutional: Negative.   HENT: Negative.   Respiratory: Negative.   Cardiovascular: Positive for chest pain.  Gastrointestinal: Negative.   Musculoskeletal: Positive for back pain.  Skin: Negative.   Neurological: Negative.   Psychiatric/Behavioral: Negative.   All other systems reviewed and are negative.    Physical Exam Updated Vital Signs BP 130/88 (BP Location: Right Arm)   Pulse 94   Temp 98.5 F (36.9 C) (Oral)   Resp 18   Ht 5\' 6"  (1.676 m)   Wt 81.6 kg (180 lb)   SpO2 100%   BMI 29.05 kg/m   Physical Exam  Constitutional: He appears well-developed and well-nourished.  HENT:  Head: Normocephalic and atraumatic.  Eyes: Conjunctivae are normal. Pupils are equal, round, and reactive to light.  Neck: Neck supple. No tracheal deviation present. No thyromegaly present.  Cardiovascular: Normal rate and regular rhythm. Exam reveals no friction rub.  No murmur  heard. Pulmonary/Chest: Effort normal and breath sounds normal.  Abdominal: Soft. Bowel sounds are normal. He exhibits no distension. There is no tenderness.  Musculoskeletal: Normal range of motion. He exhibits no edema or tenderness.  Neurological: He is alert. Coordination normal.  Skin: Skin is warm and dry. No rash noted.  Psychiatric: He has a normal mood and affect.  Nursing note and vitals reviewed.    ED Treatments / Results  Labs (all labs ordered are listed, but only abnormal results are displayed) Labs Reviewed  CBC WITH DIFFERENTIAL/PLATELET  BASIC METABOLIC PANEL    EKG  EKG  Interpretation  Date/Time:  Sunday January 15 2018 21:03:34 EDT Ventricular Rate:  87 PR Interval:    QRS Duration: 76 QT Interval:  332 QTC Calculation: 400 R Axis:   77 Text Interpretation:  Sinus rhythm ST elev, probable normal early repol pattern since last tracing no significant change Confirmed by Noemi Chapel [phone]) on 01/15/2018 9:06:43 PM      Results for orders placed or performed during the hospital encounter of 01/15/18  CBC with Differential/Platelet  Result Value Ref Range   WBC 6.6 4.0 - 10.5 K/uL   RBC 4.38 4.22 - 5.81 MIL/uL   Hemoglobin 12.6 (L) 13.0 - 17.0 g/dL   HCT 40.5 39.0 - 52.0 %   MCV 92.5 78.0 - 100.0 fL   MCH 28.8 26.0 - 34.0 pg   MCHC 31.1 30.0 - 36.0 g/dL   RDW 13.7 11.5 - 15.5 %   Platelets 214 150 - 400 K/uL   Neutrophils Relative % 48 %   Neutro Abs 3.1 1.7 - 7.7 K/uL   Lymphocytes Relative 40 %   Lymphs Abs 2.7 0.7 - 4.0 K/uL   Monocytes Relative 10 %   Monocytes Absolute 0.7 0.1 - 1.0 K/uL   Eosinophils Relative 2 %   Eosinophils Absolute 0.1 0.0 - 0.7 K/uL   Basophils Relative 0 %   Basophils Absolute 0.0 0.0 - 0.1 K/uL  Basic metabolic panel  Result Value Ref Range   Sodium 141 135 - 145 mmol/L   Potassium 4.1 3.5 - 5.1 mmol/L   Chloride 107 101 - 111 mmol/L   CO2 24 22 - 32 mmol/L   Glucose, Bld 105 (H) 65 - 99 mg/dL   BUN 23 (H) 6 - 20 mg/dL   Creatinine, Ser 0.94 0.61 - 1.24 mg/dL   Calcium 9.4 8.9 - 10.3 mg/dL   GFR calc non Af Amer >60 >60 mL/min   GFR calc Af Amer >60 >60 mL/min   Anion gap 10 5 - 15   Dg Chest 2 View  Result Date: 12/31/2017 CLINICAL DATA:  Chest burning sensation today.  Shortness of breath. EXAM: CHEST  2 VIEW COMPARISON:  04/21/2017 FINDINGS: The heart size and mediastinal contours are within normal limits. Both lungs are clear. No pleural effusion or pneumothorax. The visualized skeletal structures are unremarkable. IMPRESSION: No active cardiopulmonary disease. Electronically Signed   By: Lajean Manes  M.D.   On: 12/31/2017 14:08   Ct Angio Chest Aorta W/cm &/or Wo/cm  Result Date: 01/15/2018 CLINICAL DATA:  Sudden sharp pain and chest, history of pericarditis EXAM: CT ANGIOGRAPHY CHEST WITH CONTRAST TECHNIQUE: Multidetector CT imaging of the chest was performed using the standard protocol during bolus administration of intravenous contrast. Multiplanar CT image reconstructions and MIPs were obtained to evaluate the vascular anatomy. CONTRAST:  165mL ISOVUE-370 IOPAMIDOL (ISOVUE-370) INJECTION 76% COMPARISON:  12/31/2017 FINDINGS: Cardiovascular: Thoracic aorta is within normal limits  within normal branching pattern. The left vertebral artery arises directly from the aorta. No aneurysmal dilatation or dissection is noted. No cardiac enlargement is seen. No pericardial effusion is seen. No coronary calcifications are noted. The pulmonary artery as visualized is within normal limits. Mediastinum/Nodes: No mediastinal or hilar adenopathy is noted. The thoracic inlet is within normal limits. Esophagus is within normal limits. Lungs/Pleura: Lungs are clear. No pleural effusion or pneumothorax. Upper Abdomen: Visualized upper abdomen is unremarkable. Musculoskeletal: No acute bony abnormality is noted. Review of the MIP images confirms the above findings. IMPRESSION: No findings to suggest thoracic aortic aneurysm or dissection. No acute abnormality is noted. Electronically Signed   By: Inez Catalina M.D.   On: 01/15/2018 22:28    Radiology No results found.  Procedures Procedures (including critical care time)  Medications Ordered in ED Medications - No data to display   Initial Impression / Assessment and Plan / ED Course  I have reviewed the triage vital signs and the nursing notes.  Pertinent labs & imaging results that were available during my care of the patient were reviewed by me and considered in my medical decision making (see chart for details).     Chest pain felt to be atypical. 1040  p.m. patient resting comfortably.  Feels mild anterior chest discomfort.  Ibuprofen ordered.  I will write prescription for ibuprofen.  Patient does have some symptoms of pericarditis i.e. pain is positional however no hard findings on physical exam or EKG evidence.  I will refer him to Dr. Harl Bowie from cardiology and I counseled patient for 5 minutes on smoking cessation and also advised that he stop marijuana Final Clinical Impressions(s) / ED Diagnoses  Diagnosis #1atypical chest pain #2 tobacco abuse Final diagnoses:  Chest pain, atypical    ED Discharge Orders    None       Orlie Dakin, MD 01/15/18 2246

## 2018-01-15 NOTE — ED Triage Notes (Signed)
Pt reports a sudden sharp pain across his chest and in his back. Pt was just diagnosed with pericarditis x 2 weeks ago.

## 2018-02-26 ENCOUNTER — Emergency Department (HOSPITAL_COMMUNITY)
Admission: EM | Admit: 2018-02-26 | Discharge: 2018-02-26 | Disposition: A | Discharge status: Home / Self Care | Payer: BLUE CROSS/BLUE SHIELD | Attending: Emergency Medicine | Admitting: Emergency Medicine

## 2018-02-26 ENCOUNTER — Emergency Department (HOSPITAL_COMMUNITY): Payer: BLUE CROSS/BLUE SHIELD

## 2018-02-26 ENCOUNTER — Encounter (HOSPITAL_COMMUNITY): Payer: Self-pay | Admitting: Emergency Medicine

## 2018-02-26 DIAGNOSIS — K5900 Constipation, unspecified: Secondary | ICD-10-CM | POA: Diagnosis not present

## 2018-02-26 DIAGNOSIS — K0889 Other specified disorders of teeth and supporting structures: Secondary | ICD-10-CM | POA: Diagnosis not present

## 2018-02-26 DIAGNOSIS — F17210 Nicotine dependence, cigarettes, uncomplicated: Secondary | ICD-10-CM | POA: Insufficient documentation

## 2018-02-26 DIAGNOSIS — R1011 Right upper quadrant pain: Secondary | ICD-10-CM | POA: Diagnosis present

## 2018-02-26 DIAGNOSIS — R1031 Right lower quadrant pain: Secondary | ICD-10-CM | POA: Insufficient documentation

## 2018-02-26 LAB — CBC WITH DIFFERENTIAL/PLATELET
BASOS ABS: 0 10*3/uL (ref 0.0–0.1)
BASOS PCT: 0 %
EOS PCT: 2 %
Eosinophils Absolute: 0.1 10*3/uL (ref 0.0–0.7)
HCT: 42.4 % (ref 39.0–52.0)
Hemoglobin: 13.8 g/dL (ref 13.0–17.0)
Lymphocytes Relative: 41 %
Lymphs Abs: 2.9 10*3/uL (ref 0.7–4.0)
MCH: 29.2 pg (ref 26.0–34.0)
MCHC: 32.5 g/dL (ref 30.0–36.0)
MCV: 89.8 fL (ref 78.0–100.0)
MONO ABS: 1 10*3/uL (ref 0.1–1.0)
Monocytes Relative: 14 %
NEUTROS ABS: 2.9 10*3/uL (ref 1.7–7.7)
Neutrophils Relative %: 43 %
Platelets: 201 10*3/uL (ref 150–400)
RBC: 4.72 MIL/uL (ref 4.22–5.81)
RDW: 12.7 % (ref 11.5–15.5)
WBC: 6.9 10*3/uL (ref 4.0–10.5)

## 2018-02-26 LAB — COMPREHENSIVE METABOLIC PANEL
ALBUMIN: 4 g/dL (ref 3.5–5.0)
ALT: 19 U/L (ref 17–63)
AST: 19 U/L (ref 15–41)
Alkaline Phosphatase: 58 U/L (ref 38–126)
Anion gap: 12 (ref 5–15)
BUN: 19 mg/dL (ref 6–20)
CHLORIDE: 98 mmol/L — AB (ref 101–111)
CO2: 27 mmol/L (ref 22–32)
CREATININE: 1.04 mg/dL (ref 0.61–1.24)
Calcium: 9.1 mg/dL (ref 8.9–10.3)
GFR calc Af Amer: >60 mL/min (ref >60)
GLUCOSE: 85 mg/dL (ref 65–99)
POTASSIUM: 3.4 mmol/L — AB (ref 3.5–5.1)
Sodium: 137 mmol/L (ref 135–145)
Total Bilirubin: 0.9 mg/dL (ref 0.3–1.2)
Total Protein: 6.9 g/dL (ref 6.5–8.1)

## 2018-02-26 MED ORDER — ACETAMINOPHEN 325 MG PO TABS: 650.0000 mg | ORAL_TABLET | Freq: Four times a day (QID) | ORAL | 0 refills | Status: AC | PRN

## 2018-02-26 MED ORDER — GI COCKTAIL ~~LOC~~
30.0000 mL | Freq: Once | ORAL | Status: AC
  Administered 2018-02-26: 30 mL via ORAL
  Filled 2018-02-26: qty 30

## 2018-02-26 MED ORDER — FAMOTIDINE 20 MG PO TABS: 20.0000 mg | ORAL_TABLET | Freq: Two times a day (BID) | ORAL | 0 refills | Status: DC

## 2018-02-26 MED ORDER — PENICILLIN V POTASSIUM 500 MG PO TABS: 500.0000 mg | ORAL_TABLET | Freq: Three times a day (TID) | ORAL | 0 refills | Status: DC

## 2018-02-26 MED ORDER — PENICILLIN V POTASSIUM 250 MG PO TABS
500.0000 mg | ORAL_TABLET | Freq: Once | ORAL | Status: AC
  Administered 2018-02-26: 500 mg via ORAL
  Filled 2018-02-26: qty 2

## 2018-02-26 MED ORDER — ACETAMINOPHEN 325 MG PO TABS
ORAL_TABLET | ORAL | Status: AC
  Administered 2018-02-26: 650 mg
  Filled 2018-02-26: qty 2

## 2018-02-26 MED ORDER — PANTOPRAZOLE SODIUM 20 MG PO TBEC: 20.0000 mg | DELAYED_RELEASE_TABLET | Freq: Every day | ORAL | 0 refills | Status: DC

## 2018-02-26 MED ORDER — MAGNESIUM CITRATE PO SOLN
1.0000 | Freq: Once | ORAL | Status: AC
  Administered 2018-02-26: 1 via ORAL
  Filled 2018-02-26: qty 296

## 2018-02-26 MED ORDER — ACETAMINOPHEN 325 MG PO TABS: 650.0000 mg | ORAL_TABLET | Freq: Once | ORAL | Status: DC

## 2018-02-26 MED ORDER — PENICILLIN V POTASSIUM 125 MG/5ML PO SOLR
125.0000 mg | Freq: Once | ORAL | Status: DC
  Filled 2018-02-26: qty 5

## 2018-02-26 NOTE — ED Provider Notes (Signed)
Emergency Department Provider Note   I have reviewed the triage vital signs and the nursing notes.   HISTORY  Chief Complaint Abdominal Pain and Constipation   HPI Maurice Williamson is a 32 y.o. male without significant past medical history the presents to the emergency department today secondary to abdominal pain.  Patient states that he has right upper quadrant and right flank abdominal pain it seems to radiate to his right shoulder.  He states that he recently had some kind of problem with his tooth and took some antibiotics and a much of over-the-counter anti-inflammatories to help it.  The next day he started having upset stomach and vomiting dark green liquid this last approximately 4 hours and that is when the pain started.  Has not gotten better since that time he is also had decreased bowel movements.  He still has some with just more hard than normal.  Not dark nor they read.  No continued vomiting.  Patient states he is try to take Ex-Lax and stool softeners without success.  No urinary or respiratory issues. No other associated or modifying symptoms.    Past Medical History:  Diagnosis Date  . Carpal tunnel syndrome     There are no active problems to display for this patient.   Past Surgical History:  Procedure Laterality Date  . HERNIA REPAIR    . TONSILLECTOMY      Current Outpatient Rx  . Order #: [id number] Class: Print  . Order #: [id number] Class: Print  . Order #: [id number] Class: Print  . Order #: [id number] Class: Print  . Order #: [id number] Class: Print  . Order #: [id number] Class: Print    Allergies Patient has no known allergies.  History reviewed. No pertinent family history.  Social History Social History   Tobacco Use  . Smoking status: Current Every Day Smoker    Packs/day: 0.50    Types: Cigarettes  . Smokeless tobacco: Never Used  Substance Use Topics  . Alcohol use: Yes    Comment: occasionally  . Drug use: No    Review of  Systems  All other systems negative except as documented in the HPI. All pertinent positives and negatives as reviewed in the HPI. ____________________________________________   PHYSICAL EXAM:  VITAL SIGNS: ED Triage Vitals [02/26/18 1638]  Enc Vitals Group     BP 131/81     Pulse Rate 67     Resp 18     Temp 98.4 F (36.9 C)     Temp Source Oral     SpO2 100 %     Weight 188 lb (85.3 kg)     Height 5\' 6"  (1.676 m)    Constitutional: Alert and oriented. Well appearing and in no acute distress. Eyes: Conjunctivae are normal. PERRL. EOMI. Head: Atraumatic. Nose: No congestion/rhinnorhea. Mouth/Throat: Mucous membranes are moist.  Oropharynx non-erythematous. Slight swelling and ttp around posterior molars with caries as well.  Neck: No stridor.  No meningeal signs.   Cardiovascular: Normal rate, regular rhythm. Good peripheral circulation. Grossly normal heart sounds.   Respiratory: Normal respiratory effort.  No retractions. Lungs CTAB. Gastrointestinal: Soft and nontender. No distention.  Musculoskeletal: No lower extremity tenderness nor edema. No gross deformities of extremities. Neurologic:  Normal speech and language. No gross focal neurologic deficits are appreciated.  Skin:  Skin is warm, dry and intact. No rash noted.  ____________________________________________   LABS (all labs ordered are listed, but only abnormal results are displayed)  Labs Reviewed  COMPREHENSIVE METABOLIC PANEL -  Abnormal; Notable for the following components:      Result Value   Potassium 3.4 (*)    Chloride 98 (*)    All other components within normal limits  CBC WITH DIFFERENTIAL/PLATELET   ____________________________________________  EKG   EKG Interpretation  Date/Time:  Sunday February 26 2018 20:19:49 EDT Ventricular Rate:  61 PR Interval:    QRS Duration: 95 QT Interval:  382 QTC Calculation: 385 R Axis:   73 Text Interpretation:  Sinus rhythm ST elevation suggests acute  pericarditis similar to multiple previous. \ Confirmed by Merrily Pew [phone]) on 02/26/2018 8:22:39 PM       ____________________________________________  RADIOLOGY  Dg Abd Acute W/chest  Result Date: 02/26/2018 CLINICAL DATA:  Vomiting.  Evaluate for free air EXAM: DG ABDOMEN ACUTE W/ 1V CHEST COMPARISON:  Chest x-ray 12/31/2017 FINDINGS: Rounded calcification in the left lower pelvis, likely phlebolith. No bowel obstruction or free air. No organomegaly. Heart and mediastinal contours are within normal limits. No focal opacities or effusions. No acute bony abnormality. IMPRESSION: No evidence of bowel obstruction or free air. No acute cardiopulmonary disease. Electronically Signed   By: Rolm Baptise M.D.   On: 02/26/2018 18:02    ____________________________________________   PROCEDURES  Procedure(s) performed:   Procedures   ____________________________________________   INITIAL IMPRESSION / ASSESSMENT AND PLAN / ED COURSE  With right sided pain to shoulder and green vomit, will check liver enzymes for gallbladder.  Took large amount of NSAIDs, could have ulcer, will check for free air and give gi cocktail to see if it eases it at all. Suspect pain and bloating feeling are related to constipation, will give mag citrate.   Workup unremarkable. Stable for dc. Needs pcp.   Likely dental infection. Will treat for same.   Pertinent labs & imaging results that were available during my care of the patient were reviewed by me and considered in my medical decision making (see chart for details).  ____________________________________________  FINAL CLINICAL IMPRESSION(S) / ED DIAGNOSES  Final diagnoses:  Constipation, unspecified constipation type  Pain, dental     MEDICATIONS GIVEN DURING THIS VISIT:  Medications  acetaminophen (TYLENOL) tablet 650 mg (650 mg Oral Not Given 02/26/18 1957)  gi cocktail (Maalox,Lidocaine,Donnatal) (30 mLs Oral Given 02/26/18 1735)    magnesium citrate solution 1 Bottle (1 Bottle Oral Given 02/26/18 1736)  acetaminophen (TYLENOL) 325 MG tablet (650 mg  Given 02/26/18 1956)  penicillin v potassium (VEETID) tablet 500 mg (500 mg Oral Given 02/26/18 2106)     NEW OUTPATIENT MEDICATIONS STARTED DURING THIS VISIT:  Discharge Medication List as of 02/26/2018  8:38 PM    START taking these medications   Details  acetaminophen (TYLENOL) 325 MG tablet Take 2 tablets (650 mg total) by mouth every 6 (six) hours as needed., Starting Sun 02/26/2018, Print    famotidine (PEPCID) 20 MG tablet Take 1 tablet (20 mg total) by mouth 2 (two) times daily., Starting Sun 02/26/2018, Print    pantoprazole (PROTONIX) 20 MG tablet Take 1 tablet (20 mg total) by mouth daily., Starting Sun 02/26/2018, Print    penicillin v potassium (VEETID) 500 MG tablet Take 1 tablet (500 mg total) by mouth 3 (three) times daily., Starting Sun 02/26/2018, Print        Note:  This note was prepared with assistance of Dragon voice recognition software. Occasional wrong-word or sound-a-like substitutions may have occurred due to the inherent limitations of voice recognition software.   Merrily Pew, MD 02/26/18 2137

## 2018-02-26 NOTE — ED Notes (Signed)
Lab in drawing blood

## 2018-02-26 NOTE — Discharge Instructions (Addendum)

## 2018-02-26 NOTE — ED Triage Notes (Signed)
Pt reports he has not had a bowel movement since Tuesday and his stomach hurts.

## 2018-04-09 ENCOUNTER — Other Ambulatory Visit: Payer: Self-pay

## 2018-04-09 ENCOUNTER — Emergency Department (HOSPITAL_COMMUNITY)
Admission: EM | Admit: 2018-04-09 | Discharge: 2018-04-09 | Disposition: A | Discharge status: Home / Self Care | Payer: BLUE CROSS/BLUE SHIELD | Attending: Emergency Medicine | Admitting: Emergency Medicine

## 2018-04-09 ENCOUNTER — Emergency Department (HOSPITAL_COMMUNITY): Payer: BLUE CROSS/BLUE SHIELD

## 2018-04-09 ENCOUNTER — Encounter (HOSPITAL_COMMUNITY): Payer: Self-pay | Admitting: *Deleted

## 2018-04-09 DIAGNOSIS — R0789 Other chest pain: Secondary | ICD-10-CM

## 2018-04-09 DIAGNOSIS — D1779 Benign lipomatous neoplasm of other sites: Secondary | ICD-10-CM | POA: Diagnosis not present

## 2018-04-09 DIAGNOSIS — K047 Periapical abscess without sinus: Secondary | ICD-10-CM | POA: Diagnosis not present

## 2018-04-09 DIAGNOSIS — K0889 Other specified disorders of teeth and supporting structures: Secondary | ICD-10-CM | POA: Diagnosis present

## 2018-04-09 DIAGNOSIS — Z79899 Other long term (current) drug therapy: Secondary | ICD-10-CM | POA: Diagnosis not present

## 2018-04-09 DIAGNOSIS — F17210 Nicotine dependence, cigarettes, uncomplicated: Secondary | ICD-10-CM | POA: Insufficient documentation

## 2018-04-09 HISTORY — DX: Disease of pericardium, unspecified: I31.9

## 2018-04-09 LAB — CBC WITH DIFFERENTIAL/PLATELET
BASOS PCT: 0 %
Basophils Absolute: 0 10*3/uL (ref 0.0–0.1)
Eosinophils Absolute: 0.2 10*3/uL (ref 0.0–0.7)
Eosinophils Relative: 3 %
HCT: 39.2 % (ref 39.0–52.0)
Hemoglobin: 12.5 g/dL — ABNORMAL LOW (ref 13.0–17.0)
LYMPHS ABS: 2.2 10*3/uL (ref 0.7–4.0)
Lymphocytes Relative: 40 %
MCH: 29.1 pg (ref 26.0–34.0)
MCHC: 31.9 g/dL (ref 30.0–36.0)
MCV: 91.2 fL (ref 78.0–100.0)
MONO ABS: 0.6 10*3/uL (ref 0.1–1.0)
Monocytes Relative: 11 %
NEUTROS ABS: 2.4 10*3/uL (ref 1.7–7.7)
Neutrophils Relative %: 46 %
PLATELETS: 236 10*3/uL (ref 150–400)
RBC: 4.3 MIL/uL (ref 4.22–5.81)
RDW: 13 % (ref 11.5–15.5)
WBC Morphology: INCREASED
WBC: 5.4 10*3/uL (ref 4.0–10.5)

## 2018-04-09 LAB — BASIC METABOLIC PANEL
Anion gap: 5 (ref 5–15)
BUN: 12 mg/dL (ref 6–20)
CALCIUM: 9 mg/dL (ref 8.9–10.3)
CO2: 26 mmol/L (ref 22–32)
Chloride: 108 mmol/L (ref 101–111)
Creatinine, Ser: 0.93 mg/dL (ref 0.61–1.24)
GFR calc Af Amer: >60 mL/min (ref >60)
GLUCOSE: 122 mg/dL — AB (ref 65–99)
Potassium: 3.9 mmol/L (ref 3.5–5.1)
Sodium: 139 mmol/L (ref 135–145)

## 2018-04-09 MED ORDER — AMOXICILLIN 500 MG PO CAPS: 500.0000 mg | ORAL_CAPSULE | Freq: Three times a day (TID) | ORAL | 0 refills | Status: DC

## 2018-04-09 MED ORDER — NAPROXEN 500 MG PO TABS: 500.0000 mg | ORAL_TABLET | Freq: Two times a day (BID) | ORAL | 0 refills | Status: DC

## 2018-04-09 MED ORDER — IOHEXOL 300 MG/ML  SOLN
75.0000 mL | Freq: Once | INTRAMUSCULAR | Status: AC | PRN
  Administered 2018-04-09: 75 mL via INTRAVENOUS

## 2018-04-09 NOTE — Discharge Instructions (Signed)
Call Dr. Williams Che surgeon to discuss removal of lower left wisdom tooth.  Naproxen for chest wall pain.

## 2018-04-09 NOTE — ED Provider Notes (Signed)
[facility] EMERGENCY DEPARTMENT Provider Note   CSN: [id number] Arrival date & time: 04/09/18  1522     History   Chief Complaint Chief Complaint  Patient presents with  . collar bone pain    HPI Maurice Williamson is a 32 y.o. male.  Complaint is left equal pain, left axilla pain, right tooth pain  HPI: 32 year old male.  Noticed that he has some discomfort just underneath his left Clavicle.  Felt it was "swollen".  States then he noticed what felt like "something over here" indicates his left axilla.  Also states he has some pain adjacent his right lower tooth.  No injury to this chest or axilla.  No fevers no chills.  No difficulty swallowing.  No shortness of breath.  No pleuritic discomfort.  Past Medical History:  Diagnosis Date  . Carpal tunnel syndrome   . Pericarditis     There are no active problems to display for this patient.   Past Surgical History:  Procedure Laterality Date  . HERNIA REPAIR    . TONSILLECTOMY          Home Medications    Prior to Admission medications   Medication Sig Start Date End Date Taking? Authorizing Provider  acetaminophen (TYLENOL) 325 MG tablet Take 2 tablets (650 mg total) by mouth every 6 (six) hours as needed. 02/26/18  Yes Mesner, Corene Cornea, MD  ibuprofen (ADVIL,MOTRIN) 800 MG tablet Take 1 tablet (800 mg total) by mouth every 8 (eight) hours as needed (Chest pain.  Take with food). 01/15/18  Yes Orlie Dakin, MD  amoxicillin (AMOXIL) 500 MG capsule Take 1 capsule (500 mg total) by mouth 3 (three) times daily. 04/09/18   Tanna Furry, MD  naproxen (NAPROSYN) 500 MG tablet Take 1 tablet (500 mg total) by mouth 2 (two) times daily. 04/09/18   Tanna Furry, MD    Family History History reviewed. No pertinent family history.  Social History Social History   Tobacco Use  . Smoking status: Current Every Day Smoker    Packs/day: 0.50    Types: Cigarettes  . Smokeless tobacco: Never Used  Substance Use Topics  . Alcohol use: Not  Currently  . Drug use: Yes    Types: Marijuana     Allergies   Patient has no known allergies.   Review of Systems Review of Systems  Constitutional: Negative for appetite change, chills, diaphoresis, fatigue and fever.  HENT: Positive for dental problem. Negative for mouth sores, sore throat and trouble swallowing.   Eyes: Negative for visual disturbance.  Respiratory: Negative for cough, chest tightness, shortness of breath and wheezing.   Cardiovascular: Negative for chest pain.  Gastrointestinal: Negative for abdominal distention, abdominal pain, diarrhea, nausea and vomiting.  Endocrine: Negative for polydipsia, polyphagia and polyuria.  Genitourinary: Negative for dysuria, frequency and hematuria.  Musculoskeletal: Negative for gait problem.       Soft tissue swelling and discomfort just inferior to the left clavicle.  Feels "something" in his left axilla  Skin: Negative for color change, pallor and rash.  Neurological: Negative for dizziness, syncope, light-headedness and headaches.  Hematological: Does not bruise/bleed easily.  Psychiatric/Behavioral: Negative for behavioral problems and confusion.     Physical Exam Updated Vital Signs BP 121/76 (BP Location: Left Arm)   Pulse (!) 109   Temp 97.9 F (36.6 C) (Temporal)   Resp 16   Ht 5\' 6"  (1.676 m)   Wt 81.6 kg (180 lb)   SpO2 100%   BMI 29.05 kg/m  Physical Exam  Constitutional: He is oriented to person, place, and time. He appears well-developed and well-nourished. No distress.  HENT:  Head: Normocephalic.  He has some soft tissue swelling  Eyes: Pupils are equal, round, and reactive to light. Conjunctivae are normal. No scleral icterus.  Neck: Normal range of motion. Neck supple. No thyromegaly present.  Cardiovascular: Normal rate and regular rhythm. Exam reveals no gallop and no friction rub.  No murmur heard. Pulmonary/Chest: Effort normal and breath sounds normal. No respiratory distress. He has no  wheezes. He has no rales.  Abdominal: Soft. Bowel sounds are normal. He exhibits no distension. There is no tenderness. There is no rebound.  Musculoskeletal: Normal range of motion.  Tenderness just inferior to the left clavicle.  Slightly asymmetric right versus left.  No fluctuance.  No erythema.  No vesicles.  He has a palpable lipoma in his left axilla inferiorly.  Neurological: He is alert and oriented to person, place, and time.  Skin: Skin is warm and dry. No rash noted.  Psychiatric: He has a normal mood and affect. His behavior is normal.     ED Treatments / Results  Labs (all labs ordered are listed, but only abnormal results are displayed) Labs Reviewed  CBC WITH DIFFERENTIAL/PLATELET - Abnormal; Notable for the following components:      Result Value   Hemoglobin 12.5 (*)    All other components within normal limits  BASIC METABOLIC PANEL - Abnormal; Notable for the following components:   Glucose, Bld 122 (*)    All other components within normal limits    EKG None  Radiology Ct Chest W Contrast  Result Date: 04/09/2018 CLINICAL DATA:  Left supraclavicular pain EXAM: CT CHEST WITH CONTRAST TECHNIQUE: Multidetector CT imaging of the chest was performed during intravenous contrast administration. CONTRAST:  5mL OMNIPAQUE IOHEXOL 300 MG/ML  SOLN COMPARISON:  01/15/2018 CT of the chest FINDINGS: Cardiovascular: No significant vascular findings. Normal heart size. No pericardial effusion. Mediastinum/Nodes: Thoracic inlet is within normal limits. No hilar or mediastinal adenopathy is noted. Some mild residual thymus tissue is noted in the anterior mediastinum. Lungs/Pleura: Lungs are clear. No pleural effusion or pneumothorax. Upper Abdomen: No acute abnormality. Musculoskeletal: No acute bony abnormality is noted. IMPRESSION: No acute abnormality identified. Electronically Signed   By: Inez Catalina M.D.   On: 04/09/2018 17:38    Procedures Procedures (including critical  care time)  Medications Ordered in ED Medications  iohexol (OMNIPAQUE) 300 MG/ML solution 75 mL (75 mLs Intravenous Contrast Given 04/09/18 1715)     Initial Impression / Assessment and Plan / ED Course  I have reviewed the triage vital signs and the nursing notes.  Pertinent labs & imaging results that were available during my care of the patient were reviewed by me and considered in my medical decision making (see chart for details).    Does not show inflammatory infectious process or abnormal tissue in the area of his left sub-clavian chest.  Likely simple muscular pain.  No CT identifiable abnormality left axilla.  Clinically a lipoma.  Plan antibiotics and naproxen for his dental infection.  Dental, and/or oral surgery follow-up  Final Clinical Impressions(s) / ED Diagnoses   Final diagnoses:  Dental infection  Chest wall pain  Lipoma of other specified sites    ED Discharge Orders        Ordered    amoxicillin (AMOXIL) 500 MG capsule  3 times daily     04/09/18 1816    naproxen (  NAPROSYN) 500 MG tablet  2 times daily     04/09/18 1816       Tanna Furry, MD 04/09/18 1851

## 2018-04-09 NOTE — ED Triage Notes (Signed)
Pt with left collar bone swelling and pain since Monday.  Also states abscess to right cheek for a 1 1/2 week. Pt denies fever or N/V, +HA since waking up today, denies taking anything for it.

## 2018-09-03 ENCOUNTER — Other Ambulatory Visit: Payer: Self-pay

## 2018-09-03 ENCOUNTER — Emergency Department (HOSPITAL_COMMUNITY)
Admission: EM | Admit: 2018-09-03 | Discharge: 2018-09-03 | Disposition: A | Discharge status: Home / Self Care | Payer: BLUE CROSS/BLUE SHIELD | Attending: Emergency Medicine | Admitting: Emergency Medicine

## 2018-09-03 ENCOUNTER — Emergency Department (HOSPITAL_COMMUNITY): Payer: BLUE CROSS/BLUE SHIELD

## 2018-09-03 ENCOUNTER — Encounter (HOSPITAL_COMMUNITY): Payer: Self-pay | Admitting: Emergency Medicine

## 2018-09-03 DIAGNOSIS — R0789 Other chest pain: Secondary | ICD-10-CM | POA: Diagnosis not present

## 2018-09-03 DIAGNOSIS — K029 Dental caries, unspecified: Secondary | ICD-10-CM | POA: Diagnosis not present

## 2018-09-03 DIAGNOSIS — F17210 Nicotine dependence, cigarettes, uncomplicated: Secondary | ICD-10-CM | POA: Insufficient documentation

## 2018-09-03 DIAGNOSIS — Z79899 Other long term (current) drug therapy: Secondary | ICD-10-CM | POA: Insufficient documentation

## 2018-09-03 DIAGNOSIS — R079 Chest pain, unspecified: Secondary | ICD-10-CM | POA: Diagnosis present

## 2018-09-03 DIAGNOSIS — Z8679 Personal history of other diseases of the circulatory system: Secondary | ICD-10-CM | POA: Diagnosis not present

## 2018-09-03 DIAGNOSIS — K0889 Other specified disorders of teeth and supporting structures: Secondary | ICD-10-CM | POA: Diagnosis not present

## 2018-09-03 LAB — CBC WITH DIFFERENTIAL/PLATELET
ABS IMMATURE GRANULOCYTES: 0.03 10*3/uL (ref 0.00–0.07)
BASOS ABS: 0 10*3/uL (ref 0.0–0.1)
Basophils Relative: 1 %
Eosinophils Absolute: 0.1 10*3/uL (ref 0.0–0.5)
Eosinophils Relative: 2 %
HCT: 41.3 % (ref 39.0–52.0)
HEMOGLOBIN: 13.1 g/dL (ref 13.0–17.0)
Immature Granulocytes: 1 %
LYMPHS PCT: 35 %
Lymphs Abs: 2.1 10*3/uL (ref 0.7–4.0)
MCH: 29.3 pg (ref 26.0–34.0)
MCHC: 31.7 g/dL (ref 30.0–36.0)
MCV: 92.4 fL (ref 80.0–100.0)
Monocytes Absolute: 0.7 10*3/uL (ref 0.1–1.0)
Monocytes Relative: 12 %
NEUTROS ABS: 2.9 10*3/uL (ref 1.7–7.7)
NRBC: 0 % (ref 0.0–0.2)
Neutrophils Relative %: 49 %
Platelets: 242 10*3/uL (ref 150–400)
RBC: 4.47 MIL/uL (ref 4.22–5.81)
RDW: 13 % (ref 11.5–15.5)
WBC: 5.9 10*3/uL (ref 4.0–10.5)

## 2018-09-03 LAB — COMPREHENSIVE METABOLIC PANEL
ALBUMIN: 3.9 g/dL (ref 3.5–5.0)
ALK PHOS: 61 U/L (ref 38–126)
ALT: 25 U/L (ref 0–44)
ANION GAP: 5 (ref 5–15)
AST: 22 U/L (ref 15–41)
BUN: 11 mg/dL (ref 6–20)
CALCIUM: 9 mg/dL (ref 8.9–10.3)
CO2: 25 mmol/L (ref 22–32)
Chloride: 109 mmol/L (ref 98–111)
Creatinine, Ser: 0.95 mg/dL (ref 0.61–1.24)
GFR calc Af Amer: >60 mL/min (ref >60)
GFR calc non Af Amer: >60 mL/min (ref >60)
GLUCOSE: 88 mg/dL (ref 70–99)
Potassium: 3.8 mmol/L (ref 3.5–5.1)
SODIUM: 139 mmol/L (ref 135–145)
Total Bilirubin: 0.4 mg/dL (ref 0.3–1.2)
Total Protein: 6.9 g/dL (ref 6.5–8.1)

## 2018-09-03 LAB — TROPONIN I: Troponin I: <0.03 ng/mL (ref <0.03)

## 2018-09-03 LAB — SEDIMENTATION RATE: Sed Rate: 9 mm/hr (ref 0–16)

## 2018-09-03 MED ORDER — DEXAMETHASONE 4 MG PO TABS: 4.0000 mg | ORAL_TABLET | Freq: Two times a day (BID) | ORAL | 0 refills | Status: AC

## 2018-09-03 MED ORDER — PREDNISONE 20 MG PO TABS
40.0000 mg | ORAL_TABLET | Freq: Once | ORAL | Status: AC
  Administered 2018-09-03: 40 mg via ORAL
  Filled 2018-09-03: qty 2

## 2018-09-03 MED ORDER — ACETAMINOPHEN 500 MG PO TABS
1000.0000 mg | ORAL_TABLET | Freq: Once | ORAL | Status: AC
  Administered 2018-09-03: 1000 mg via ORAL
  Filled 2018-09-03: qty 2

## 2018-09-03 MED ORDER — IBUPROFEN 600 MG PO TABS: 600.0000 mg | ORAL_TABLET | Freq: Four times a day (QID) | ORAL | 0 refills | Status: AC

## 2018-09-03 MED ORDER — IBUPROFEN 800 MG PO TABS
800.0000 mg | ORAL_TABLET | Freq: Once | ORAL | Status: AC
  Administered 2018-09-03: 800 mg via ORAL
  Filled 2018-09-03: qty 1

## 2018-09-03 MED ORDER — IOPAMIDOL (ISOVUE-370) INJECTION 76%
75.0000 mL | Freq: Once | INTRAVENOUS | Status: AC | PRN
  Administered 2018-09-03: 75 mL via INTRAVENOUS

## 2018-09-03 MED ORDER — AMOXICILLIN 500 MG PO CAPS: 500.0000 mg | ORAL_CAPSULE | Freq: Three times a day (TID) | ORAL | 0 refills | Status: DC

## 2018-09-03 MED ORDER — ONDANSETRON HCL 4 MG PO TABS
4.0000 mg | ORAL_TABLET | Freq: Once | ORAL | Status: AC
  Administered 2018-09-03: 4 mg via ORAL
  Filled 2018-09-03: qty 1

## 2018-09-03 NOTE — ED Provider Notes (Signed)
[facility] EMERGENCY DEPARTMENT Provider Note   CSN: [id number] Arrival date & time: 09/03/18  1535     History   Chief Complaint Chief Complaint  Patient presents with  . Chest Pain    HPI LEVELLE Maurice Williamson is a 32 y.o. male.  Patient is a 32 year old male who presents to the emergency department with a complaint of right and left chest pain.  The patient states that he has been having some right-sided chest pain over the past 1-1/2 weeks.  The patient states he feels as though there is a knot on the right side is worse with a deep breath it is worse with certain movement.  He says at times he feels as though he is having muscle spasm in the area, and it feels similar to some symptoms that he has had in the past when he has had pericarditis.  He has not had any recent injury or trauma to the right side.  No recent upper respiratory infections.  The patient also states that he has some left side pain.  This pain is mostly under the clavicle and extends toward the sternum.  Patient states this area also feels as though it is muscle spasm going on in this area as well.  No recent injury or trauma noted.  The patient states that he came in today because he got a very sharp pain in his chest that was similar to previous pericarditis and he wanted to be evaluated.  The history is provided by the patient.    Past Medical History:  Diagnosis Date  . Carpal tunnel syndrome   . Pericarditis     There are no active problems to display for this patient.   Past Surgical History:  Procedure Laterality Date  . HERNIA REPAIR    . TONSILLECTOMY          Home Medications    Prior to Admission medications   Medication Sig Start Date End Date Taking? Authorizing Provider  acetaminophen (TYLENOL) 325 MG tablet Take 2 tablets (650 mg total) by mouth every 6 (six) hours as needed. 02/26/18   Mesner, Corene Cornea, MD  amoxicillin (AMOXIL) 500 MG capsule Take 1 capsule (500 mg total) by mouth 3  (three) times daily. 04/09/18   Tanna Furry, MD  ibuprofen (ADVIL,MOTRIN) 800 MG tablet Take 1 tablet (800 mg total) by mouth every 8 (eight) hours as needed (Chest pain.  Take with food). 01/15/18   Orlie Dakin, MD  naproxen (NAPROSYN) 500 MG tablet Take 1 tablet (500 mg total) by mouth 2 (two) times daily. 04/09/18   Tanna Furry, MD    Family History History reviewed. No pertinent family history.  Social History Social History   Tobacco Use  . Smoking status: Current Every Day Smoker    Packs/day: 0.50    Types: Cigarettes  . Smokeless tobacco: Never Used  Substance Use Topics  . Alcohol use: Not Currently  . Drug use: Yes    Types: Marijuana    Comment: yesterday     Allergies   Patient has no known allergies.   Review of Systems Review of Systems  Constitutional: Negative for activity change.       All ROS Neg except as noted in HPI  HENT: Positive for congestion. Negative for nosebleeds.   Eyes: Negative for photophobia and discharge.  Respiratory: Negative for cough, shortness of breath and wheezing.   Cardiovascular: Positive for chest pain. Negative for palpitations and leg swelling.  Gastrointestinal: Negative for  abdominal pain and blood in stool.  Genitourinary: Negative for dysuria, frequency and hematuria.  Musculoskeletal: Negative for arthralgias, back pain and neck pain.  Skin: Negative.   Neurological: Negative for dizziness, seizures and speech difficulty.  Psychiatric/Behavioral: Negative for confusion and hallucinations.     Physical Exam Updated Vital Signs BP 110/65   Pulse (!) 59   Temp 98.5 F (36.9 C) (Oral)   Resp 17   Ht 5\' 6"  (1.676 m)   Wt 81.6 kg   SpO2 99%   BMI 29.05 kg/m   Physical Exam  Constitutional: He is oriented to person, place, and time. He appears well-developed and well-nourished.  Non-toxic appearance.  HENT:  Head: Normocephalic.  Right Ear: Tympanic membrane and external ear normal.  Left Ear: Tympanic  membrane and external ear normal.  Nasal congestion.  Eyes: Pupils are equal, round, and reactive to light. EOM and lids are normal.  Neck: Normal range of motion. Neck supple. Carotid bruit is not present.  Cardiovascular: Normal rate, regular rhythm, S1 normal, S2 normal, normal heart sounds, intact distal pulses and normal pulses.  No extrasystoles are present. Exam reveals no gallop and no friction rub.  No murmur heard. Pulmonary/Chest: Breath sounds normal. No respiratory distress. He exhibits tenderness and bony tenderness. He exhibits no mass, no crepitus and no retraction.  There is symmetrical rise and fall of the chest.  The patient speaks in complete sentences without problem.    Abdominal: Soft. Bowel sounds are normal. There is no tenderness. There is no guarding.  Musculoskeletal: Normal range of motion.  Lymphadenopathy:       Head (right side): No submandibular adenopathy present.       Head (left side): No submandibular adenopathy present.    He has no cervical adenopathy.  Neurological: He is alert and oriented to person, place, and time. He has normal strength. No cranial nerve deficit or sensory deficit.  Skin: Skin is warm and dry.  Psychiatric: He has a normal mood and affect. His speech is normal.  Nursing note and vitals reviewed.    ED Treatments / Results  Labs (all labs ordered are listed, but only abnormal results are displayed) Labs Reviewed  CBC WITH DIFFERENTIAL/PLATELET  COMPREHENSIVE METABOLIC PANEL  TROPONIN I  SEDIMENTATION RATE    EKG None  Radiology Dg Chest 2 View  Result Date: 09/03/2018 CLINICAL DATA:  Right-sided chest pain over the past few weeks. EXAM: CHEST - 2 VIEW COMPARISON:  Chest x-ray 12/31/2017 and chest CT 01/15/2018. FINDINGS: The heart size and mediastinal contours are within normal limits. Both lungs are clear. The visualized skeletal structures are unremarkable. IMPRESSION: Normal chest x-ray. Electronically Signed   By:  Marijo Sanes M.D.   On: 09/03/2018 16:55    Procedures Procedures (including critical care time)  Medications Ordered in ED Medications - No data to display   Initial Impression / Assessment and Plan / ED Course  I have reviewed the triage vital signs and the nursing notes.  Pertinent labs & imaging results that were available during my care of the patient were reviewed by me and considered in my medical decision making (see chart for details).       Final Clinical Impressions(s) / ED Diagnoses MDM Admission vital signs reviewed.  Pulse oximetry is 99% on room air.  Within normal limits by my interpretation.  The electrocardiogram shows sinus rhythm with some ST elevation probably due to early repolarization versus pericarditis.  The patient has right and left  chest area pain.  The pain on the left has been going on for months, but the pain on the right has been present for the past 1 to 1-1/2 weeks.  Complete blood count and comprehensive metabolic panel are both normal.  The troponin is also read as normal.  Chest x-ray is negative for any acute changes or problems.  CT angios chest pending.  Recheck.  Patient resting.  He says the pain has improved significantly.  CT angios chest returns negative for pericardial effusion, pulmonary embolus, or other acute chest changes.  Patient will be treated with steroid and anti-inflammatory medication.  Patient strongly encouraged to see cardiology for additional evaluation concerning his chest discomfort.  Patient is in agreement with this plan.   Final diagnoses:  Atypical chest pain  History of pericarditis  Pain due to dental caries    ED Discharge Orders         Ordered    dexamethasone (DECADRON) 4 MG tablet  2 times daily with meals     09/03/18 2022    ibuprofen (ADVIL,MOTRIN) 600 MG tablet  4 times daily     09/03/18 2022    amoxicillin (AMOXIL) 500 MG capsule  3 times daily     09/03/18 2022           Lily Kocher, PA-C 09/03/18 2024    Nat Christen, MD 09/04/18 1910

## 2018-09-03 NOTE — ED Notes (Signed)
Patient transported to CT 

## 2018-09-03 NOTE — ED Notes (Signed)
Pt returned from CT °

## 2018-09-03 NOTE — Discharge Instructions (Signed)
Your vital signs are within normal limits.  Oxygen level is normal.  Lab work shows no acute abnormality.  Chest x-ray was normal.  And the CT scan does not show any evidence of blood clot, fluid in the pericardium, or other complications.  Please see Dr. Harl Bowie, or a member of his team for cardiology evaluation concerning your chest pain.  Please use Amoxil for your dental problem.  Please see a dentist as soon as possible.  Please use 600 mg of ibuprofen, and 1000 mg of Tylenol every 6 hours as needed for chest discomfort.  Please use Decadron 2 times daily with food.  Return to the emergency department if any emergent changes in your condition, problems, or concerns.

## 2018-09-03 NOTE — ED Triage Notes (Signed)
PT c/o right sided chest pain with movement and inhalation worsening over the past few weeks. PT states hx of pericarditis.

## 2019-02-19 ENCOUNTER — Encounter (HOSPITAL_COMMUNITY): Payer: Self-pay

## 2019-02-19 ENCOUNTER — Other Ambulatory Visit: Payer: Self-pay

## 2019-02-19 ENCOUNTER — Emergency Department (HOSPITAL_COMMUNITY)
Admission: EM | Admit: 2019-02-19 | Discharge: 2019-02-19 | Disposition: A | Discharge status: Home / Self Care | Payer: BLUE CROSS/BLUE SHIELD | Attending: Emergency Medicine | Admitting: Emergency Medicine

## 2019-02-19 DIAGNOSIS — Y929 Unspecified place or not applicable: Secondary | ICD-10-CM | POA: Diagnosis not present

## 2019-02-19 DIAGNOSIS — Y939 Activity, unspecified: Secondary | ICD-10-CM | POA: Insufficient documentation

## 2019-02-19 DIAGNOSIS — Z79899 Other long term (current) drug therapy: Secondary | ICD-10-CM | POA: Diagnosis not present

## 2019-02-19 DIAGNOSIS — F17210 Nicotine dependence, cigarettes, uncomplicated: Secondary | ICD-10-CM | POA: Diagnosis not present

## 2019-02-19 DIAGNOSIS — X500XXA Overexertion from strenuous movement or load, initial encounter: Secondary | ICD-10-CM | POA: Insufficient documentation

## 2019-02-19 DIAGNOSIS — S39011A Strain of muscle, fascia and tendon of abdomen, initial encounter: Secondary | ICD-10-CM | POA: Insufficient documentation

## 2019-02-19 DIAGNOSIS — S3991XA Unspecified injury of abdomen, initial encounter: Secondary | ICD-10-CM | POA: Diagnosis present

## 2019-02-19 DIAGNOSIS — Y999 Unspecified external cause status: Secondary | ICD-10-CM | POA: Diagnosis not present

## 2019-02-19 MED ORDER — SODIUM CHLORIDE 0.9% FLUSH: 3.0000 mL | Freq: Once | INTRAVENOUS | Status: DC

## 2019-02-19 MED ORDER — CYCLOBENZAPRINE HCL 10 MG PO TABS: 10.0000 mg | ORAL_TABLET | Freq: Three times a day (TID) | ORAL | 0 refills | Status: DC

## 2019-02-19 MED ORDER — DICLOFENAC SODIUM 75 MG PO TBEC: 75.0000 mg | DELAYED_RELEASE_TABLET | Freq: Two times a day (BID) | ORAL | 0 refills | Status: DC

## 2019-02-19 NOTE — ED Provider Notes (Signed)
[facility] EMERGENCY DEPARTMENT Provider Note   CSN: [id number] Arrival date & time: 02/19/19  0272    History   Chief Complaint Chief Complaint  Patient presents with  . Abdominal Pain    HPI Maurice Williamson is a 33 y.o. male.     Patient is a 33 year old male presenting to the emergency department with a complaint of right abdomen pain.  The patient states this problem is been going on for nearly 2 weeks.  He says the pain seems to be getting worse.  He has pain when he is lying down on his right side.  He has pain with change of position.  The patient states that he sometimes feels full even without eating very much.  There is been no vomiting reported.  He has had some mild nausea.  No fever, no sweats reported, and no chills.  The patient stated he had an episode of diarrhea earlier this morning, but no other diarrhea noted during this 2-week time.  No constipation problems.  The patient has not noted any blood in his stools, no blood in his urine.  He does report that his urine seems to be a little darker than usual.  It is of note that the patient has not had any recent accident or injury.  He does report doing a lot of lifting.  The patient presents now for assistance with this issue as it seems as though the pains is getting worse instead of getting better.  The history is provided by the patient.    Past Medical History:  Diagnosis Date  . Carpal tunnel syndrome   . Pericarditis     There are no active problems to display for this patient.   Past Surgical History:  Procedure Laterality Date  . HERNIA REPAIR    . TONSILLECTOMY          Home Medications    Prior to Admission medications   Medication Sig Start Date End Date Taking? Authorizing Provider  acetaminophen (TYLENOL) 325 MG tablet Take 2 tablets (650 mg total) by mouth every 6 (six) hours as needed. 02/26/18   Mesner, Corene Cornea, MD  amoxicillin (AMOXIL) 500 MG capsule Take 1 capsule (500 mg total) by mouth  3 (three) times daily. 09/03/18   Lily Kocher, PA-C  dexamethasone (DECADRON) 4 MG tablet Take 1 tablet (4 mg total) by mouth 2 (two) times daily with a meal. 09/03/18   Lily Kocher, PA-C  ibuprofen (ADVIL,MOTRIN) 600 MG tablet Take 1 tablet (600 mg total) by mouth 4 (four) times daily. 09/03/18   Lily Kocher, PA-C  naproxen (NAPROSYN) 500 MG tablet Take 1 tablet (500 mg total) by mouth 2 (two) times daily. 04/09/18   Tanna Furry, MD    Family History No family history on file.  Social History Social History   Tobacco Use  . Smoking status: Current Every Day Smoker    Packs/day: 0.50    Types: Cigarettes  . Smokeless tobacco: Never Used  Substance Use Topics  . Alcohol use: Yes  . Drug use: Yes    Types: Marijuana    Comment: yesterday     Allergies   Patient has no known allergies.   Review of Systems Review of Systems  Constitutional: Negative for activity change, appetite change, chills, diaphoresis, fatigue and fever.       All ROS Neg except as noted in HPI  HENT: Negative.   Eyes: Negative for photophobia and discharge.  Respiratory: Negative for cough, shortness  of breath and wheezing.   Cardiovascular: Negative for chest pain and palpitations.  Gastrointestinal: Positive for abdominal pain and nausea. Negative for blood in stool, constipation, diarrhea, rectal pain and vomiting.  Genitourinary: Negative for dysuria, frequency and hematuria.  Musculoskeletal: Negative for arthralgias, back pain and neck pain.  Skin: Negative.   Neurological: Negative for dizziness, seizures and speech difficulty.  Psychiatric/Behavioral: Negative for confusion and hallucinations.     Physical Exam Updated Vital Signs BP 111/72 (BP Location: Right Arm)   Pulse 73   Temp 98.7 F (37.1 C) (Oral)   Resp 16   Ht 5\' 6"  (1.676 m)   Wt 81.6 kg   SpO2 97%   BMI 29.05 kg/m   Physical Exam Vitals signs and nursing note reviewed.  Constitutional:      Appearance: He is  well-developed. He is not toxic-appearing.  HENT:     Head: Normocephalic.     Right Ear: Tympanic membrane and external ear normal.     Left Ear: Tympanic membrane and external ear normal.  Eyes:     General: Lids are normal.     Pupils: Pupils are equal, round, and reactive to light.  Neck:     Musculoskeletal: Normal range of motion and neck supple.     Vascular: No carotid bruit.  Cardiovascular:     Rate and Rhythm: Normal rate and regular rhythm.     Pulses: Normal pulses.     Heart sounds: Normal heart sounds.  Pulmonary:     Effort: No respiratory distress.     Breath sounds: Normal breath sounds.  Abdominal:     General: Bowel sounds are normal.     Palpations: Abdomen is soft.     Tenderness: There is no abdominal tenderness. There is no guarding.     Comments: There is no pain to palpation of the abdominal wall.  There is noted pain in the right flank and the right upper abdomen with change of position.  Bowel sounds are present and active.  I cannot demonstrate a hernia with Valsalva or other maneuvers.  There is no palpable hematoma in this area.  No palpable deformity of the ribs.  There is no splenomegaly or hepatomegaly appreciated.    Musculoskeletal: Normal range of motion.  Lymphadenopathy:     Head:     Right side of head: No submandibular adenopathy.     Left side of head: No submandibular adenopathy.     Cervical: No cervical adenopathy.  Skin:    General: Skin is warm and dry.  Neurological:     Mental Status: He is alert and oriented to person, place, and time.     Cranial Nerves: No cranial nerve deficit.     Sensory: No sensory deficit.  Psychiatric:        Speech: Speech normal.      ED Treatments / Results  Labs (all labs ordered are listed, but only abnormal results are displayed) Labs Reviewed  LIPASE, BLOOD  COMPREHENSIVE METABOLIC PANEL  CBC  URINALYSIS, ROUTINE W REFLEX MICROSCOPIC    EKG None  Radiology No results found.   Procedures Procedures (including critical care time)  Medications Ordered in ED Medications  sodium chloride flush (NS) 0.9 % injection 3 mL (has no administration in time range)     Initial Impression / Assessment and Plan / ED Course  I have reviewed the triage vital signs and the nursing notes.  Pertinent labs & imaging results that were available during  my care of the patient were reviewed by me and considered in my medical decision making (see chart for details).         Final Clinical Impressions(s) / ED Diagnoses MDM  Vital signs within normal limits.  The examination favors a muscle strain involving the right upper abdominal wall extending into the flank area.  This pain is reproduced by range of motion in this area.  No pain to palpation.  No history of fever or chills.  No history of diarrhea or constipation.  No recent operations or procedures.  Food does not change the severity of the pain or improve the pain.  I have asked the patient to use warm tub soaks for 15 to 20 minutes daily.  I have asked the patient to use Flexeril 3 times daily, and diclofenac 2 times daily with food.  He is also advised to return to the emergency department if any changes in his condition, problems, or concerns.   Final diagnoses:  Abdominal wall strain, initial encounter    ED Discharge Orders         Ordered    cyclobenzaprine (FLEXERIL) 10 MG tablet  3 times daily     02/19/19 0957    diclofenac (VOLTAREN) 75 MG EC tablet  2 times daily     02/19/19 0957           Lily Kocher, PA-C 02/19/19 1010    Maudie Flakes, MD 02/19/19 1521

## 2019-02-19 NOTE — ED Triage Notes (Signed)
Pt reports that he has Right upper abdominal pain for 2 weeks and feels like a bulge in the area/ Some nausea and one episode of diarrhea this am

## 2019-02-19 NOTE — Discharge Instructions (Addendum)
Your vital signs are within normal limits.  Your oxygen level is 97% on room air.  Within normal limits by my interpretation.  Your examination favors muscle strain involving the muscles of the abdominal wall.  Please use a warm Epson salt tub soak daily for the next 5 days for about 15 to 20 minutes.  Please use Flexeril 3 times daily.  Use diclofenac 2 times daily with a meal.  Flexeril may cause drowsiness.  Please do not drive a vehicle, operate machinery, drink alcohol, or participate in activities requiring concentration when taking this medication.  Please see your primary physician or return to the emergency department if any changes in your condition, worsening of your symptoms, problems, or concerns.

## 2019-05-21 ENCOUNTER — Other Ambulatory Visit: Payer: Self-pay

## 2019-05-21 ENCOUNTER — Emergency Department (HOSPITAL_COMMUNITY)
Admission: EM | Admit: 2019-05-21 | Discharge: 2019-05-21 | Disposition: A | Discharge status: Home / Self Care | Payer: BC Managed Care – PPO | Attending: Emergency Medicine | Admitting: Emergency Medicine

## 2019-05-21 ENCOUNTER — Encounter (HOSPITAL_COMMUNITY): Payer: Self-pay | Admitting: Emergency Medicine

## 2019-05-21 ENCOUNTER — Emergency Department (HOSPITAL_COMMUNITY): Payer: BC Managed Care – PPO

## 2019-05-21 DIAGNOSIS — Z113 Encounter for screening for infections with a predominantly sexual mode of transmission: Secondary | ICD-10-CM | POA: Diagnosis not present

## 2019-05-21 DIAGNOSIS — R0789 Other chest pain: Secondary | ICD-10-CM | POA: Diagnosis present

## 2019-05-21 DIAGNOSIS — Z79899 Other long term (current) drug therapy: Secondary | ICD-10-CM | POA: Insufficient documentation

## 2019-05-21 DIAGNOSIS — F17210 Nicotine dependence, cigarettes, uncomplicated: Secondary | ICD-10-CM | POA: Insufficient documentation

## 2019-05-21 LAB — LIPASE, BLOOD: Lipase: 53 U/L — ABNORMAL HIGH (ref 11–51)

## 2019-05-21 LAB — COMPREHENSIVE METABOLIC PANEL
ALT: 40 U/L (ref 0–44)
AST: 27 U/L (ref 15–41)
Albumin: 3.7 g/dL (ref 3.5–5.0)
Alkaline Phosphatase: 66 U/L (ref 38–126)
Anion gap: 8 (ref 5–15)
BUN: 13 mg/dL (ref 6–20)
CO2: 24 mmol/L (ref 22–32)
Calcium: 8.8 mg/dL — ABNORMAL LOW (ref 8.9–10.3)
Chloride: 107 mmol/L (ref 98–111)
Creatinine, Ser: 0.89 mg/dL (ref 0.61–1.24)
GFR calc Af Amer: >60 mL/min (ref >60)
GFR calc non Af Amer: >60 mL/min (ref >60)
Glucose, Bld: 94 mg/dL (ref 70–99)
Potassium: 4 mmol/L (ref 3.5–5.1)
Sodium: 139 mmol/L (ref 135–145)
Total Bilirubin: 0.4 mg/dL (ref 0.3–1.2)
Total Protein: 6.4 g/dL — ABNORMAL LOW (ref 6.5–8.1)

## 2019-05-21 LAB — CBC WITH DIFFERENTIAL/PLATELET
Abs Immature Granulocytes: 0.03 10*3/uL (ref 0.00–0.07)
Basophils Absolute: 0 10*3/uL (ref 0.0–0.1)
Basophils Relative: 1 %
Eosinophils Absolute: 0.1 10*3/uL (ref 0.0–0.5)
Eosinophils Relative: 1 %
HCT: 40.3 % (ref 39.0–52.0)
Hemoglobin: 12.9 g/dL — ABNORMAL LOW (ref 13.0–17.0)
Immature Granulocytes: 1 %
Lymphocytes Relative: 33 %
Lymphs Abs: 2 10*3/uL (ref 0.7–4.0)
MCH: 29.1 pg (ref 26.0–34.0)
MCHC: 32 g/dL (ref 30.0–36.0)
MCV: 91 fL (ref 80.0–100.0)
Monocytes Absolute: 0.8 10*3/uL (ref 0.1–1.0)
Monocytes Relative: 13 %
Neutro Abs: 3.1 10*3/uL (ref 1.7–7.7)
Neutrophils Relative %: 51 %
Platelets: 236 10*3/uL (ref 150–400)
RBC: 4.43 MIL/uL (ref 4.22–5.81)
RDW: 13 % (ref 11.5–15.5)
WBC: 6 10*3/uL (ref 4.0–10.5)
nRBC: 0 % (ref 0.0–0.2)

## 2019-05-21 LAB — URINALYSIS, ROUTINE W REFLEX MICROSCOPIC
Bilirubin Urine: NEGATIVE
Glucose, UA: NEGATIVE mg/dL
Hgb urine dipstick: NEGATIVE
Ketones, ur: NEGATIVE mg/dL
Leukocytes,Ua: NEGATIVE
Nitrite: NEGATIVE
Protein, ur: NEGATIVE mg/dL
Specific Gravity, Urine: 1.02 (ref 1.005–1.030)
pH: 7 (ref 5.0–8.0)

## 2019-05-21 LAB — TROPONIN I (HIGH SENSITIVITY): Troponin I (High Sensitivity): <2 ng/L (ref <18)

## 2019-05-21 MED ORDER — PREDNISONE 50 MG PO TABS
60.0000 mg | ORAL_TABLET | Freq: Once | ORAL | Status: AC
  Administered 2019-05-21: 60 mg via ORAL
  Filled 2019-05-21: qty 1

## 2019-05-21 MED ORDER — PREDNISONE 10 MG PO TABS: ORAL_TABLET | ORAL | 0 refills | Status: AC

## 2019-05-21 NOTE — ED Triage Notes (Signed)
PT c/o left and right sided chest pain constant that increases with movement. PT states hx of pericarditis a few months ago. PT denies any SOB or cough.

## 2019-05-21 NOTE — Discharge Instructions (Signed)
Your lab tests, EKG and chest x-ray are all stable today.  You are being treated for possible pericarditis given the nature of your symptoms, however your EKG and your exam does not clearly suggest this diagnosis.  You would benefit from a formal cardiology evaluation and have been referred to Dr. Bronson Ing for this.  Please call his office for an appointment.

## 2019-05-21 NOTE — ED Notes (Signed)
Pt in radiology at this time. 

## 2019-05-21 NOTE — ED Provider Notes (Signed)
[facility] EMERGENCY DEPARTMENT Provider Note   CSN: [id number] Arrival date & time: 05/21/19  0830    History   Chief Complaint Chief Complaint  Patient presents with  . Chest Pain    HPI Maurice Williamson is a 33 y.o. male with a history of carpal tunnel syndrome, and a presumptive diagnosis of pericarditis here February 2019, although not confirmed by echo presenting with a 2-day history of left and right sided chest pain which is sharp and worsened with certain movements such as twisting his torso but also worsened at rest when he is supine, not worsened with exertion.   He denies symptoms while at work, worked a full shift yesterday where he works in [employer], quantity and is active without heavy lifting or exertion.  He denies shortness of breath, cough, palpitations, peripheral edema, also no dizziness, nausea, vomiting or abdominal pain.  Patient does not smoke cigarettes, but does smoke marijuana.  HPI: A 33 year old patient presents for evaluation of chest pain. Initial onset of pain was more than 6 hours ago. The patient's chest pain is sharp and is not worse with exertion. The patient's chest pain is middle- or left-sided, is not well-localized, is not described as heaviness/pressure/tightness and does not radiate to the arms/jaw/neck. The patient does not complain of nausea and denies diaphoresis. The patient has smoked in the past 90 days. The patient has no history of stroke, has no history of peripheral artery disease, denies any history of treated diabetes, has no relevant family history of coronary artery disease (first degree relative at less than age 72), is not hypertensive, has no history of hypercholesterolemia and does not have an elevated BMI (>=30).   The history is provided by the patient.    Past Medical History:  Diagnosis Date  . Carpal tunnel syndrome   . Pericarditis     There are no active problems to display for this patient.   Past Surgical History:  Procedure  Laterality Date  . HERNIA REPAIR    . TONSILLECTOMY          Home Medications    Prior to Admission medications   Medication Sig Start Date End Date Taking? Authorizing Provider  acetaminophen (TYLENOL) 325 MG tablet Take 2 tablets (650 mg total) by mouth every 6 (six) hours as needed. 02/26/18  Yes Mesner, Corene Cornea, MD  cyclobenzaprine (FLEXERIL) 10 MG tablet Take 1 tablet (10 mg total) by mouth 3 (three) times daily. 02/19/19  Yes Lily Kocher, PA-C  amoxicillin (AMOXIL) 500 MG capsule Take 1 capsule (500 mg total) by mouth 3 (three) times daily. Patient not taking: Reported on 05/21/2019 09/03/18   Lily Kocher, PA-C  dexamethasone (DECADRON) 4 MG tablet Take 1 tablet (4 mg total) by mouth 2 (two) times daily with a meal. Patient not taking: Reported on 05/21/2019 09/03/18   Lily Kocher, PA-C  diclofenac (VOLTAREN) 75 MG EC tablet Take 1 tablet (75 mg total) by mouth 2 (two) times daily. Patient not taking: Reported on 05/21/2019 02/19/19   Lily Kocher, PA-C  ibuprofen (ADVIL,MOTRIN) 600 MG tablet Take 1 tablet (600 mg total) by mouth 4 (four) times daily. Patient not taking: Reported on 05/21/2019 09/03/18   Lily Kocher, PA-C  naproxen (NAPROSYN) 500 MG tablet Take 1 tablet (500 mg total) by mouth 2 (two) times daily. Patient not taking: Reported on 05/21/2019 04/09/18   Tanna Furry, MD  predniSONE (DELTASONE) 10 MG tablet Take 6 tablets day one, 5 tablets day two, 4 tablets day three,  3 tablets day four, 2 tablets day five, then 1 tablet day six 05/21/19   Evalee Jefferson, PA-C    Family History No family history on file.  Social History Social History   Tobacco Use  . Smoking status: Current Every Day Smoker    Packs/day: 0.50    Types: Cigarettes  . Smokeless tobacco: Never Used  Substance Use Topics  . Alcohol use: Yes  . Drug use: Yes    Types: Marijuana    Comment: yesterday     Allergies   Patient has no known allergies.   Review of Systems Review of Systems   Constitutional: Negative for fever.  HENT: Negative for congestion and sore throat.   Eyes: Negative.   Respiratory: Negative for chest tightness and shortness of breath.   Cardiovascular: Positive for chest pain.  Gastrointestinal: Negative for abdominal pain and nausea.  Genitourinary: Negative.   Musculoskeletal: Negative for arthralgias, joint swelling and neck pain.  Skin: Negative.  Negative for rash and wound.  Neurological: Negative for dizziness, weakness, light-headedness, numbness and headaches.  Psychiatric/Behavioral: Negative.      Physical Exam Updated Vital Signs BP 120/83   Pulse 72   Temp 98.3 F (36.8 C) (Oral)   Resp 15   Ht 5\' 6"  (1.676 m)   Wt 86.2 kg   SpO2 98%   BMI 30.67 kg/m   Physical Exam Vitals signs and nursing note reviewed.  Constitutional:      Appearance: He is well-developed.  HENT:     Head: Normocephalic and atraumatic.  Eyes:     Conjunctiva/sclera: Conjunctivae normal.  Neck:     Musculoskeletal: Normal range of motion.  Cardiovascular:     Rate and Rhythm: Normal rate and regular rhythm.     Heart sounds: Normal heart sounds. No murmur. No friction rub.  Pulmonary:     Effort: Pulmonary effort is normal.     Breath sounds: Normal breath sounds. No wheezing.  Abdominal:     General: Bowel sounds are normal.     Palpations: Abdomen is soft.     Tenderness: There is no abdominal tenderness.  Musculoskeletal: Normal range of motion.  Skin:    General: Skin is warm and dry.  Neurological:     Mental Status: He is alert.      ED Treatments / Results  Labs (all labs ordered are listed, but only abnormal results are displayed) Labs Reviewed  COMPREHENSIVE METABOLIC PANEL - Abnormal; Notable for the following components:      Result Value   Calcium 8.8 (*)    Total Protein 6.4 (*)    All other components within normal limits  CBC WITH DIFFERENTIAL/PLATELET - Abnormal; Notable for the following components:   Hemoglobin  12.9 (*)    All other components within normal limits  LIPASE, BLOOD - Abnormal; Notable for the following components:   Lipase 53 (*)    All other components within normal limits  URINALYSIS, ROUTINE W REFLEX MICROSCOPIC  GC/CHLAMYDIA PROBE AMP ([place]) NOT AT [facility]  TROPONIN I (HIGH SENSITIVITY)    EKG EKG Interpretation  Date/Time:  Monday May 21 2019 08:45:33 EDT Ventricular Rate:  85 PR Interval:    QRS Duration: 85 QT Interval:  343 QTC Calculation: 408 R Axis:   64 Text Interpretation:  Sinus rhythm When compared with ECG of 09/03/2018 No significant change was found Confirmed by Francine Graven [phone]) on 05/21/2019 9:26:47 AM   Radiology Dg Chest 2 View  Result Date:  05/21/2019 CLINICAL DATA:  Chest pain and shortness of breath EXAM: CHEST - 2 VIEW COMPARISON:  December 31, 2017 FINDINGS: Lungs are clear. Heart size and pulmonary vascularity are normal. No adenopathy. No pneumothorax. No bone lesions. IMPRESSION: No edema or consolidation. Electronically Signed   By: Lowella Grip III M.D.   On: 05/21/2019 09:49    Procedures Procedures (including critical care time)  Medications Ordered in ED Medications  predniSONE (DELTASONE) tablet 60 mg (has no administration in time range)     Initial Impression / Assessment and Plan / ED Course  I have reviewed the triage vital signs and the nursing notes.  Pertinent labs & imaging results that were available during my care of the patient were reviewed by me and considered in my medical decision making (see chart for details).     HEAR Score: 1  Patient with midsternal chest pain which is not reproducible on exam.  It is worsened with certain positions and movement, yet not exertional.  Chest x-ray and lab tests are unremarkable.  EKG normal.  Patient has a heart score of 1 with a normal troponin, symptoms began yesterday morning.  Although he does have symptoms that are worse with certain positions, there are  no EKG or exam findings to suggest pericarditis.  However, with a prior episode of similar symptoms he responded well to prednisone, therefore was started on this medication today.  He was referred to cardiology for further evaluation of this atypical symptom.  He was given return precautions.  Final Clinical Impressions(s) / ED Diagnoses   Final diagnoses:  Atypical chest pain    ED Discharge Orders         Ordered    predniSONE (DELTASONE) 10 MG tablet     05/21/19 1223           Evalee Jefferson, PA-C 05/21/19 Zeba, Chapman, DO 05/22/19 1706

## 2019-05-22 LAB — GC/CHLAMYDIA PROBE AMP (~~LOC~~) NOT AT ARMC
Chlamydia: NEGATIVE
Neisseria Gonorrhea: NEGATIVE

## 2019-05-29 ENCOUNTER — Emergency Department (HOSPITAL_COMMUNITY): Payer: BC Managed Care – PPO

## 2019-05-29 ENCOUNTER — Emergency Department (HOSPITAL_COMMUNITY)
Admission: EM | Admit: 2019-05-29 | Discharge: 2019-05-29 | Disposition: A | Discharge status: Home / Self Care | Payer: BC Managed Care – PPO | Attending: Emergency Medicine | Admitting: Emergency Medicine

## 2019-05-29 ENCOUNTER — Encounter (HOSPITAL_COMMUNITY): Payer: Self-pay | Admitting: *Deleted

## 2019-05-29 ENCOUNTER — Other Ambulatory Visit: Payer: Self-pay

## 2019-05-29 DIAGNOSIS — M25511 Pain in right shoulder: Secondary | ICD-10-CM | POA: Insufficient documentation

## 2019-05-29 DIAGNOSIS — K047 Periapical abscess without sinus: Secondary | ICD-10-CM | POA: Diagnosis not present

## 2019-05-29 DIAGNOSIS — F17210 Nicotine dependence, cigarettes, uncomplicated: Secondary | ICD-10-CM | POA: Insufficient documentation

## 2019-05-29 DIAGNOSIS — M542 Cervicalgia: Secondary | ICD-10-CM

## 2019-05-29 DIAGNOSIS — Z79899 Other long term (current) drug therapy: Secondary | ICD-10-CM | POA: Insufficient documentation

## 2019-05-29 MED ORDER — CYCLOBENZAPRINE HCL 10 MG PO TABS: 10.0000 mg | ORAL_TABLET | Freq: Two times a day (BID) | ORAL | 0 refills | Status: DC | PRN

## 2019-05-29 MED ORDER — DICLOFENAC SODIUM 50 MG PO TBEC: 50.0000 mg | DELAYED_RELEASE_TABLET | Freq: Two times a day (BID) | ORAL | 0 refills | Status: AC

## 2019-05-29 MED ORDER — AMOXICILLIN 500 MG PO CAPS: 500.0000 mg | ORAL_CAPSULE | Freq: Three times a day (TID) | ORAL | 0 refills | Status: DC

## 2019-05-29 MED ORDER — KETOROLAC TROMETHAMINE 60 MG/2ML IM SOLN
60.0000 mg | Freq: Once | INTRAMUSCULAR | Status: AC
  Administered 2019-05-29: 60 mg via INTRAMUSCULAR
  Filled 2019-05-29: qty 2

## 2019-05-29 NOTE — ED Notes (Signed)
Patient transported to X-ray 

## 2019-05-29 NOTE — Discharge Instructions (Addendum)
X-rays were normal.  He will be sore for several days.  Prescription for pain medicine and muscle relaxer E prescribed to your pharmacy.
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
## 2019-05-29 NOTE — ED Notes (Signed)
Patient back from x-ray 
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
## 2021-01-26 ENCOUNTER — Encounter (HOSPITAL_COMMUNITY): Payer: Self-pay | Admitting: *Deleted

## 2021-01-26 ENCOUNTER — Emergency Department (HOSPITAL_COMMUNITY)
Admission: EM | Admit: 2021-01-26 | Discharge: 2021-01-26 | Disposition: A | Discharge status: Home / Self Care | Payer: BC Managed Care – PPO | Attending: Emergency Medicine | Admitting: Emergency Medicine

## 2021-01-26 ENCOUNTER — Other Ambulatory Visit: Payer: Self-pay

## 2021-01-26 ENCOUNTER — Emergency Department (HOSPITAL_COMMUNITY): Payer: BC Managed Care – PPO

## 2021-01-26 DIAGNOSIS — W2209XA Striking against other stationary object, initial encounter: Secondary | ICD-10-CM | POA: Diagnosis not present

## 2021-01-26 DIAGNOSIS — Y939 Activity, unspecified: Secondary | ICD-10-CM | POA: Insufficient documentation

## 2021-01-26 DIAGNOSIS — S92425A Nondisplaced fracture of distal phalanx of left great toe, initial encounter for closed fracture: Secondary | ICD-10-CM

## 2021-01-26 DIAGNOSIS — T1490XA Injury, unspecified, initial encounter: Secondary | ICD-10-CM

## 2021-01-26 DIAGNOSIS — M79644 Pain in right finger(s): Secondary | ICD-10-CM | POA: Insufficient documentation

## 2021-01-26 DIAGNOSIS — Y999 Unspecified external cause status: Secondary | ICD-10-CM | POA: Diagnosis not present

## 2021-01-26 DIAGNOSIS — M62838 Other muscle spasm: Secondary | ICD-10-CM | POA: Diagnosis not present

## 2021-01-26 DIAGNOSIS — F17210 Nicotine dependence, cigarettes, uncomplicated: Secondary | ICD-10-CM | POA: Diagnosis not present

## 2021-01-26 DIAGNOSIS — S99922A Unspecified injury of left foot, initial encounter: Secondary | ICD-10-CM | POA: Diagnosis present

## 2021-01-26 DIAGNOSIS — Y929 Unspecified place or not applicable: Secondary | ICD-10-CM | POA: Insufficient documentation

## 2021-01-26 MED ORDER — METHOCARBAMOL 500 MG PO TABS: 500.0000 mg | ORAL_TABLET | Freq: Two times a day (BID) | ORAL | 0 refills | Status: DC

## 2021-01-26 NOTE — Discharge Instructions (Addendum)
Your xray showed a fracture of your left great toe. Please wear post op shoe and follow up with Dr. Aline Brochure orthopedist for further evaluation. While at home please rest, ice, and elevate your foot to help reduce pain/swelling. You can take Ibuprofen 800 mg every 8 hours as needed for pain and 1,000 mg Tylenol every 8 hours as needed for pain.   The xray of your finger did not show any fractures.   Please use muscle relaxer as needed for your neck muscle spasms. You can also apply heat to this area and lightly massage. DO NOT DRIVE WHILE ON THE MUSCLE RELAXER AS IT CAN MAKE YOU DROWSY.   Return to the ED for any worsening symptoms

## 2021-01-26 NOTE — ED Notes (Signed)
Entered room and introduced self to patient. Pt appears to be resting in chair, respirations are even and unlabored with equal chest rise and fall. Call bell within reach. Pt educated on call light use and hourly rounding, verbalized understanding and in agreement at this time. All questions and concerns voiced addressed. Refreshments offered and provided per patient request.
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
## 2021-01-26 NOTE — ED Notes (Signed)
ED Provider at bedside. 

## 2021-10-07 ENCOUNTER — Encounter (HOSPITAL_COMMUNITY): Payer: Self-pay | Admitting: *Deleted

## 2021-10-07 ENCOUNTER — Emergency Department (HOSPITAL_COMMUNITY)
Admission: EM | Admit: 2021-10-07 | Discharge: 2021-10-07 | Disposition: A | Discharge status: Home / Self Care | Payer: BC Managed Care – PPO | Attending: Emergency Medicine | Admitting: Emergency Medicine

## 2021-10-07 DIAGNOSIS — F17210 Nicotine dependence, cigarettes, uncomplicated: Secondary | ICD-10-CM | POA: Diagnosis not present

## 2021-10-07 DIAGNOSIS — R1031 Right lower quadrant pain: Secondary | ICD-10-CM | POA: Insufficient documentation

## 2021-10-07 DIAGNOSIS — R519 Headache, unspecified: Secondary | ICD-10-CM | POA: Insufficient documentation

## 2021-10-07 DIAGNOSIS — R1032 Left lower quadrant pain: Secondary | ICD-10-CM | POA: Diagnosis not present

## 2021-10-07 DIAGNOSIS — Z20822 Contact with and (suspected) exposure to covid-19: Secondary | ICD-10-CM | POA: Diagnosis not present

## 2021-10-07 DIAGNOSIS — R112 Nausea with vomiting, unspecified: Secondary | ICD-10-CM | POA: Diagnosis not present

## 2021-10-07 DIAGNOSIS — R1030 Lower abdominal pain, unspecified: Secondary | ICD-10-CM

## 2021-10-07 LAB — COMPREHENSIVE METABOLIC PANEL
ALT: 43 U/L (ref 0–44)
AST: 29 U/L (ref 15–41)
Albumin: 3.9 g/dL (ref 3.5–5.0)
Alkaline Phosphatase: 62 U/L (ref 38–126)
Anion gap: 7 (ref 5–15)
BUN: 15 mg/dL (ref 6–20)
CO2: 26 mmol/L (ref 22–32)
Calcium: 9.1 mg/dL (ref 8.9–10.3)
Chloride: 104 mmol/L (ref 98–111)
Creatinine, Ser: 0.93 mg/dL (ref 0.61–1.24)
GFR, Estimated: >60 mL/min (ref >60)
Glucose, Bld: 84 mg/dL (ref 70–99)
Potassium: 3.9 mmol/L (ref 3.5–5.1)
Sodium: 137 mmol/L (ref 135–145)
Total Bilirubin: 0.3 mg/dL (ref 0.3–1.2)
Total Protein: 6.7 g/dL (ref 6.5–8.1)

## 2021-10-07 LAB — URINALYSIS, ROUTINE W REFLEX MICROSCOPIC
Bilirubin Urine: NEGATIVE
Glucose, UA: NEGATIVE mg/dL
Hgb urine dipstick: NEGATIVE
Ketones, ur: NEGATIVE mg/dL
Leukocytes,Ua: NEGATIVE
Nitrite: NEGATIVE
Protein, ur: NEGATIVE mg/dL
Specific Gravity, Urine: >1.03 — ABNORMAL HIGH (ref 1.005–1.030)
pH: 5.5 (ref 5.0–8.0)

## 2021-10-07 LAB — CBC WITH DIFFERENTIAL/PLATELET
Abs Immature Granulocytes: 0.01 10*3/uL (ref 0.00–0.07)
Basophils Absolute: 0 10*3/uL (ref 0.0–0.1)
Basophils Relative: 1 %
Eosinophils Absolute: 0.2 10*3/uL (ref 0.0–0.5)
Eosinophils Relative: 3 %
HCT: 43.3 % (ref 39.0–52.0)
Hemoglobin: 14 g/dL (ref 13.0–17.0)
Immature Granulocytes: 0 %
Lymphocytes Relative: 38 %
Lymphs Abs: 2.2 10*3/uL (ref 0.7–4.0)
MCH: 29.9 pg (ref 26.0–34.0)
MCHC: 32.3 g/dL (ref 30.0–36.0)
MCV: 92.3 fL (ref 80.0–100.0)
Monocytes Absolute: 0.7 10*3/uL (ref 0.1–1.0)
Monocytes Relative: 13 %
Neutro Abs: 2.6 10*3/uL (ref 1.7–7.7)
Neutrophils Relative %: 45 %
Platelets: 253 10*3/uL (ref 150–400)
RBC: 4.69 MIL/uL (ref 4.22–5.81)
RDW: 13.5 % (ref 11.5–15.5)
WBC: 5.7 10*3/uL (ref 4.0–10.5)
nRBC: 0 % (ref 0.0–0.2)

## 2021-10-07 LAB — RESP PANEL BY RT-PCR (FLU A&B, COVID) ARPGX2
Influenza A by PCR: NEGATIVE
Influenza B by PCR: NEGATIVE
SARS Coronavirus 2 by RT PCR: NEGATIVE

## 2021-10-07 LAB — LIPASE, BLOOD: Lipase: 134 U/L — ABNORMAL HIGH (ref 11–51)

## 2021-10-07 MED ORDER — METOCLOPRAMIDE HCL 5 MG/ML IJ SOLN
10.0000 mg | Freq: Once | INTRAMUSCULAR | Status: AC
  Administered 2021-10-07: 10 mg via INTRAVENOUS
  Filled 2021-10-07: qty 2

## 2021-10-07 MED ORDER — KETOROLAC TROMETHAMINE 30 MG/ML IJ SOLN
30.0000 mg | Freq: Once | INTRAMUSCULAR | Status: AC
  Administered 2021-10-07: 30 mg via INTRAVENOUS
  Filled 2021-10-07: qty 1

## 2021-10-07 NOTE — Discharge Instructions (Signed)
You were seen in the emergency department for evaluation of a headache and lower abdominal pain.  You had blood work urinalysis COVID and flu testing.  Your symptoms improved after some medication.  Please return to the emergency department if any worsening or concerning symptoms.

## 2021-10-07 NOTE — ED Triage Notes (Signed)
Headache with vomiting

## 2021-10-07 NOTE — ED Notes (Signed)
Dc instructions reviewed with pt no questions or concerns at this time. Will follow up as needed.
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
## 2022-03-20 ENCOUNTER — Emergency Department (HOSPITAL_COMMUNITY): Payer: BC Managed Care – PPO

## 2022-03-20 ENCOUNTER — Emergency Department (HOSPITAL_COMMUNITY)
Admission: EM | Admit: 2022-03-20 | Discharge: 2022-03-20 | Disposition: A | Discharge status: Home / Self Care | Payer: BC Managed Care – PPO | Attending: Emergency Medicine | Admitting: Emergency Medicine

## 2022-03-20 ENCOUNTER — Encounter (HOSPITAL_COMMUNITY): Payer: Self-pay | Admitting: Emergency Medicine

## 2022-03-20 ENCOUNTER — Other Ambulatory Visit: Payer: Self-pay

## 2022-03-20 DIAGNOSIS — U071 COVID-19: Secondary | ICD-10-CM | POA: Insufficient documentation

## 2022-03-20 DIAGNOSIS — M791 Myalgia, unspecified site: Secondary | ICD-10-CM | POA: Diagnosis present

## 2022-03-20 DIAGNOSIS — S70361A Insect bite (nonvenomous), right thigh, initial encounter: Secondary | ICD-10-CM | POA: Diagnosis not present

## 2022-03-20 DIAGNOSIS — W57XXXA Bitten or stung by nonvenomous insect and other nonvenomous arthropods, initial encounter: Secondary | ICD-10-CM | POA: Insufficient documentation

## 2022-03-20 DIAGNOSIS — R6889 Other general symptoms and signs: Secondary | ICD-10-CM

## 2022-03-20 LAB — CBC WITH DIFFERENTIAL/PLATELET
Basophils Absolute: 0 10*3/uL (ref 0.0–0.1)
Basophils Relative: 0 %
Eosinophils Absolute: 0.1 10*3/uL (ref 0.0–0.5)
Eosinophils Relative: 2 %
HCT: 44.8 % (ref 39.0–52.0)
Hemoglobin: 14.8 g/dL (ref 13.0–17.0)
Lymphocytes Relative: 64 %
Lymphs Abs: 2.4 10*3/uL (ref 0.7–4.0)
MCH: 29.5 pg (ref 26.0–34.0)
MCHC: 33 g/dL (ref 30.0–36.0)
MCV: 89.4 fL (ref 80.0–100.0)
Monocytes Absolute: 0.3 10*3/uL (ref 0.1–1.0)
Monocytes Relative: 8 %
Neutro Abs: 1 10*3/uL — ABNORMAL LOW (ref 1.7–7.7)
Neutrophils Relative %: 26 %
Platelets: 181 10*3/uL (ref 150–400)
RBC: 5.01 MIL/uL (ref 4.22–5.81)
RDW: 13 % (ref 11.5–15.5)
WBC Morphology: REACTIVE
WBC: 3.7 10*3/uL — ABNORMAL LOW (ref 4.0–10.5)
nRBC: 0 % (ref 0.0–0.2)

## 2022-03-20 LAB — COMPREHENSIVE METABOLIC PANEL
ALT: 35 U/L (ref 0–44)
AST: 29 U/L (ref 15–41)
Albumin: 4.2 g/dL (ref 3.5–5.0)
Alkaline Phosphatase: 53 U/L (ref 38–126)
Anion gap: 4 — ABNORMAL LOW (ref 5–15)
BUN: 16 mg/dL (ref 6–20)
CO2: 26 mmol/L (ref 22–32)
Calcium: 8.9 mg/dL (ref 8.9–10.3)
Chloride: 107 mmol/L (ref 98–111)
Creatinine, Ser: 0.94 mg/dL (ref 0.61–1.24)
GFR, Estimated: >60 mL/min (ref >60)
Glucose, Bld: 91 mg/dL (ref 70–99)
Potassium: 4.2 mmol/L (ref 3.5–5.1)
Sodium: 137 mmol/L (ref 135–145)
Total Bilirubin: 0.4 mg/dL (ref 0.3–1.2)
Total Protein: 7.6 g/dL (ref 6.5–8.1)

## 2022-03-20 LAB — TROPONIN I (HIGH SENSITIVITY): Troponin I (High Sensitivity): 2 ng/L (ref <18)

## 2022-03-20 LAB — LIPASE, BLOOD: Lipase: 27 U/L (ref 11–51)

## 2022-03-20 LAB — RESP PANEL BY RT-PCR (FLU A&B, COVID) ARPGX2
Influenza A by PCR: NEGATIVE
Influenza B by PCR: NEGATIVE
SARS Coronavirus 2 by RT PCR: POSITIVE — AB

## 2022-03-20 MED ORDER — DOXYCYCLINE HYCLATE 100 MG PO TABS
100.0000 mg | ORAL_TABLET | Freq: Once | ORAL | Status: AC
  Administered 2022-03-20: 100 mg via ORAL
  Filled 2022-03-20: qty 1

## 2022-03-20 MED ORDER — DOXYCYCLINE HYCLATE 100 MG PO CAPS
100.0000 mg | ORAL_CAPSULE | Freq: Two times a day (BID) | ORAL | 0 refills | Status: DC
Start: 2022-03-20 — End: 2024-06-24

## 2022-03-20 NOTE — ED Triage Notes (Signed)
Patient c/o left side chest pain/tightness that radiates into back. Denies any shortness or breath.Per patient hx of pericarditis in which he states this feels similar. Patient states Monday started having body aches, chills, sweats, and diarrhea. Patient reports removing tick last week.  ?

## 2022-03-20 NOTE — Discharge Instructions (Signed)
As discussed it is possible you have a simple viral infection and your symptoms will run their course and resolve.  However you are being covered for the possibility of a tick infection, specifically recommended spotted fever since you have recently had a embedded tick on your leg.  Complete the entire course of the antibiotics prescribed.  Plan to get rechecked for any persistent or worsening symptoms.  As discussed your COVID test is currently still pending, check your MyChart for this test result.  If you are positive for COVID you will need to remain in home isolation through today which is day 5 of your symptoms and then you will need to wear a mask when in public for an additional 5 days.  Rest make sure you are drinking plenty of fluids.  You may take Tylenol for body aches. ?

## 2022-03-20 NOTE — ED Notes (Signed)
ED Provider at bedside. 

## 2022-03-20 NOTE — ED Provider Notes (Signed)
?[place] ?Provider Note ? ? ?CSN: [id number] ?Arrival date & time: 03/20/22  1129 ? ?  ? ?History ? ?Chief Complaint  ?Patient presents with  ? Chest Pain  ? ? ?Maurice Williamson is a 36 y.o. male presenting with multiple complaints, initially he reports developing flulike symptoms 6 days ago described as generalized myalgias, nasal congestion with clear rhinorrhea, intermittent chills and hot flashes but no documented fevers.  Additionally reports having diarrhea, describing loose stools for several days but has been resolved for the past 2 days without intervention.  He denies abdominal pain, denies shortness of breath but does endorse a pressure sensation in his left chest which radiates into his left upper back region.  He has a history of pericarditis and states that his symptoms feel similar.  He also has had a nonproductive cough, denies shortness of breath.  He has noticed decreased sensation to smell, he is not COVID vaccinated and denies any obvious exposures but is concerned about this possibility as well.  Lastly, he did have an embedded tick on his right posterior thigh about 3 weeks ago which he removed and has had no localizing symptoms at the site but is concerned about possibility of tick infection given his other symptoms. He denies rash. He has found no alleviators for his symptoms. ? ?The history is provided by the patient.  ? ?  ? ?Home Medications ?Prior to Admission medications   ?Medication Sig Start Date End Date Taking? Authorizing Provider  ?doxycycline (VIBRAMYCIN) 100 MG capsule Take 1 capsule (100 mg total) by mouth 2 (two) times daily. 03/20/22  Yes Sharod Petsch, Almyra Free, PA-C  ?acetaminophen (TYLENOL) 325 MG tablet Take 2 tablets (650 mg total) by mouth every 6 (six) hours as needed. 02/26/18   Mesner, Corene Cornea, MD  ?cyclobenzaprine (FLEXERIL) 10 MG tablet Take 1 tablet (10 mg total) by mouth 2 (two) times daily as needed. 05/29/19   Nat Christen, MD  ?dexamethasone (DECADRON) 4 MG  tablet Take 1 tablet (4 mg total) by mouth 2 (two) times daily with a meal. ?Patient not taking: Reported on 05/21/2019 09/03/18   Lily Kocher, PA-C  ?diclofenac (VOLTAREN) 50 MG EC tablet Take 1 tablet (50 mg total) by mouth 2 (two) times daily. 05/29/19   Nat Christen, MD  ?ibuprofen (ADVIL,MOTRIN) 600 MG tablet Take 1 tablet (600 mg total) by mouth 4 (four) times daily. ?Patient not taking: Reported on 05/21/2019 09/03/18   Lily Kocher, PA-C  ?methocarbamol (ROBAXIN) 500 MG tablet Take 1 tablet (500 mg total) by mouth 2 (two) times daily. 01/26/21   Eustaquio Maize, PA-C  ?naproxen (NAPROSYN) 500 MG tablet Take 1 tablet (500 mg total) by mouth 2 (two) times daily. ?Patient not taking: Reported on 05/21/2019 04/09/18   Tanna Furry, MD  ?predniSONE (DELTASONE) 10 MG tablet Take 6 tablets day one, 5 tablets day two, 4 tablets day three, 3 tablets day four, 2 tablets day five, then 1 tablet day six 05/21/19   Evalee Jefferson, PA-C  ?   ? ?Allergies    ?Patient has no known allergies.   ? ?Review of Systems   ?Review of Systems  ?Constitutional:  Positive for chills and diaphoresis. Negative for fever.  ?HENT:  Positive for congestion. Negative for sore throat.   ?Eyes: Negative.   ?Respiratory:  Positive for cough. Negative for chest tightness and shortness of breath.   ?Cardiovascular:  Positive for chest pain. Negative for palpitations and leg swelling.  ?Gastrointestinal:  Positive for diarrhea.  Negative for abdominal pain, nausea and vomiting.  ?Genitourinary: Negative.   ?Musculoskeletal:  Negative for arthralgias, joint swelling and neck pain.  ?Skin: Negative.  Negative for color change, rash and wound.  ?Neurological:  Negative for dizziness, weakness, light-headedness, numbness and headaches.  ?Psychiatric/Behavioral: Negative.    ? ?Physical Exam ?Updated Vital Signs ?BP 111/67 (BP Location: Left Arm)   Pulse (!) 57   Temp 98.3 ?F (36.8 ?C) (Oral)   Resp 13   Ht '5\' 8"'$  (1.727 m)   Wt 83.9 kg   SpO2 99%   BMI  28.13 kg/m?  ?Physical Exam ? ?ED Results / Procedures / Treatments   ?Labs ?(all labs ordered are listed, but only abnormal results are displayed) ?Labs Reviewed  ?RESP PANEL BY RT-PCR (FLU A&B, COVID) ARPGX2 - Abnormal; Notable for the following components:  ?    Result Value  ? SARS Coronavirus 2 by RT PCR POSITIVE (*)   ? All other components within normal limits  ?CBC WITH DIFFERENTIAL/PLATELET - Abnormal; Notable for the following components:  ? WBC 3.7 (*)   ? Neutro Abs 1.0 (*)   ? All other components within normal limits  ?COMPREHENSIVE METABOLIC PANEL - Abnormal; Notable for the following components:  ? Anion gap 4 (*)   ? All other components within normal limits  ?LIPASE, BLOOD  ?ROCKY MTN SPOTTED FVR ABS PNL(IGG+IGM)  ?LYME DISEASE SEROLOGY W/REFLEX  ?TROPONIN I (HIGH SENSITIVITY)  ? ? ?EKG ?EKG Interpretation ? ?Date/Time:  Saturday Mar 20 2022 12:31:04 EDT ?Ventricular Rate:  64 ?PR Interval:  118 ?QRS Duration: 90 ?QT Interval:  364 ?QTC Calculation: 375 ?R Axis:   61 ?Text Interpretation: Normal sinus rhythm with sinus arrhythmia Early repolarization Normal ECG When compared with ECG of 21-May-2019 08:45, PREVIOUS ECG IS PRESENT t wave amplitude increased from last tracing Confirmed by Dorie Rank [phone]) on 03/20/2022 1:12:04 PM ? ?Radiology ?DG Chest Port 1 View ? ?Result Date: 03/20/2022 ?CLINICAL DATA:  Left-sided chest pain and tightness EXAM: PORTABLE CHEST 1 VIEW COMPARISON:  05/21/2019 FINDINGS: The heart size and mediastinal contours are within normal limits. Both lungs are clear. The visualized skeletal structures are unremarkable. IMPRESSION: No acute abnormality of the lungs in AP portable projection. Electronically Signed   By: Delanna Ahmadi M.D.   On: 03/20/2022 13:44   ? ?Procedures ?Procedures  ? ? ?Medications Ordered in ED ?Medications  ?doxycycline (VIBRA-TABS) tablet 100 mg (100 mg Oral Given 03/20/22 1627)  ? ? ?ED Course/ Medical Decision Making/ A&P ?  ?                         ?Medical Decision Making ?Patient with an approximate 1 week history of flulike symptoms, he has significant history of an embedded tick bite occurring 3 weeks ago, raising concern for possible tickborne infection, he has no rash, denies abdominal pain, muscle spasms or other symptoms suggesting take infection however.  No COVID exposures, however this was considered as a possible source of his symptoms.  He denies chest pain or shortness of breath.  Labs and imaging obtained were reassuring, no pneumonia, no electrolyte abnormalities suggesting take infection.  Patient was desirous of leaving prior to respiratory panel results.  He does have MyChart and he was advised to check his results to ensure he does not have COVID.  He was given prescription for doxycycline given the tick exposure.  He was also given instructions that if his COVID is positive he  will need to wear a mask when in public for the next 5 days.  He has already timed out of his home quarantine as his symptoms began 6 days ago.  Discussed home treatments for symptom relief, recheck for any worsening symptoms. ? ?Amount and/or Complexity of Data Reviewed ?Labs: ordered. ?   Details: Labs are reassuring. ? ?Late note: After patient was discharged, his respiratory panel resulted and he did test positive for COVID.  He was called and advised of this results. ?Radiology: ordered. ?   Details: Negative for pneumonia. ? ?Risk ?Prescription drug management. ? ? ? ? ? ? ? ? ? ? ?Final Clinical Impression(s) / ED Diagnoses ?Final diagnoses:  ?Flu-like symptoms  ?Tick bite of right thigh, initial encounter  ?COVID-19  ? ? ?Rx / DC Orders ?ED Discharge Orders   ? ?      Ordered  ?  doxycycline (VIBRAMYCIN) 100 MG capsule  2 times daily       ? 03/20/22 1621  ? ?  ?  ? ?  ? ? ?  ?Evalee Jefferson, PA-C ?03/20/22 1911 ? ?  ?Dorie Rank, MD ?03/21/22 [phone] ? ?

## 2022-03-22 LAB — LYME DISEASE SEROLOGY W/REFLEX: Lyme Total Antibody EIA: NEGATIVE

## 2022-03-23 LAB — ROCKY MTN SPOTTED FVR ABS PNL(IGG+IGM)
RMSF IgG: NEGATIVE
RMSF IgM: 2.39 index — ABNORMAL HIGH (ref 0.00–0.89)

## 2023-04-04 ENCOUNTER — Other Ambulatory Visit: Payer: Self-pay

## 2023-04-04 ENCOUNTER — Emergency Department (HOSPITAL_COMMUNITY)
Admission: EM | Admit: 2023-04-04 | Discharge: 2023-04-04 | Disposition: A | Discharge status: Home / Self Care | Payer: 59 | Attending: Emergency Medicine | Admitting: Emergency Medicine

## 2023-04-04 ENCOUNTER — Encounter (HOSPITAL_COMMUNITY): Payer: Self-pay | Admitting: Emergency Medicine

## 2023-04-04 ENCOUNTER — Emergency Department (HOSPITAL_COMMUNITY): Payer: 59

## 2023-04-04 DIAGNOSIS — M25511 Pain in right shoulder: Secondary | ICD-10-CM | POA: Insufficient documentation

## 2023-04-04 MED ORDER — LIDOCAINE 5 % EX PTCH
1.0000 | MEDICATED_PATCH | CUTANEOUS | 0 refills | Status: AC
Start: 2023-04-04 — End: ?

## 2023-04-04 MED ORDER — NAPROXEN 500 MG PO TABS: 500.0000 mg | ORAL_TABLET | Freq: Two times a day (BID) | ORAL | 0 refills | Status: DC

## 2023-04-04 MED ORDER — CYCLOBENZAPRINE HCL 10 MG PO TABS: 10.0000 mg | ORAL_TABLET | Freq: Two times a day (BID) | ORAL | 0 refills | Status: AC | PRN

## 2023-04-04 MED ORDER — KETOROLAC TROMETHAMINE 10 MG PO TABS
10.0000 mg | ORAL_TABLET | Freq: Once | ORAL | Status: AC
  Administered 2023-04-04: 10 mg via ORAL
  Filled 2023-04-04: qty 1

## 2023-04-04 MED ORDER — LIDOCAINE 5 % EX PTCH
1.0000 | MEDICATED_PATCH | CUTANEOUS | Status: DC
Start: 2023-04-04 — End: 2023-04-04
  Administered 2023-04-04: 1 via TRANSDERMAL
  Filled 2023-04-04: qty 1

## 2023-04-04 NOTE — ED Provider Notes (Signed)
[place] EMERGENCY DEPARTMENT AT [facility] Provider Note   CSN: [id number] Arrival date & time: 04/04/23  1703     History  Chief Complaint  Patient presents with   Shoulder Pain    Maurice Williamson is a 37 y.o. male.  The ER with right shoulder pain for the past 2 and half months.  States he was lifting weights, doing a bench press when he injured his shoulder.  He states he was trying to press the bar up and his right arm gave out and his shoulder tilted back.  Since then he had pain in the right collarbone, right trapezius and right deltoid area sometimes down into the area of his right shoulder blade.  He has not done any weightlifting and has been avoiding using that side without relief.  He also tried ibuprofen without relief.  No fevers or chills, he states sometimes he has pain with moving his neck to the right and states if he leans to the right side for too long he sometimes gets some numbness in the right deltoid area.  He did not feel any pops when he injured himself, did not have any swelling or numbness or tingling at that time.  He has not stated but he states this happened.   Shoulder Pain      Home Medications Prior to Admission medications   Medication Sig Start Date End Date Taking? Authorizing Provider  lidocaine (LIDODERM) 5 % Place 1 patch onto the skin daily. Remove & Discard patch within 12 hours or as directed by MD 04/04/23  Yes Cristi Loron, Jocelynne Duquette A, PA-C  acetaminophen (TYLENOL) 325 MG tablet Take 2 tablets (650 mg total) by mouth every 6 (six) hours as needed. 02/26/18   Mesner, Barbara Cower, MD  cyclobenzaprine (FLEXERIL) 10 MG tablet Take 1 tablet (10 mg total) by mouth 2 (two) times daily as needed. 04/04/23   Carmel Sacramento A, PA-C  dexamethasone (DECADRON) 4 MG tablet Take 1 tablet (4 mg total) by mouth 2 (two) times daily with a meal. Patient not taking: Reported on 05/21/2019 09/03/18   Ivery Quale, PA-C  diclofenac (VOLTAREN) 50 MG EC tablet Take 1  tablet (50 mg total) by mouth 2 (two) times daily. 05/29/19   Donnetta Hutching, MD  doxycycline (VIBRAMYCIN) 100 MG capsule Take 1 capsule (100 mg total) by mouth 2 (two) times daily. 03/20/22   Idol, Raynelle Fanning, PA-C  ibuprofen (ADVIL,MOTRIN) 600 MG tablet Take 1 tablet (600 mg total) by mouth 4 (four) times daily. Patient not taking: Reported on 05/21/2019 09/03/18   Ivery Quale, PA-C  methocarbamol (ROBAXIN) 500 MG tablet Take 1 tablet (500 mg total) by mouth 2 (two) times daily. 01/26/21   Hyman Hopes, Margaux, PA-C  naproxen (NAPROSYN) 500 MG tablet Take 1 tablet (500 mg total) by mouth 2 (two) times daily. 04/04/23   Carmel Sacramento A, PA-C  predniSONE (DELTASONE) 10 MG tablet Take 6 tablets day one, 5 tablets day two, 4 tablets day three, 3 tablets day four, 2 tablets day five, then 1 tablet day six 05/21/19   Burgess Amor, PA-C      Allergies    Patient has no known allergies.    Review of Systems   Review of Systems  Physical Exam Updated Vital Signs BP 118/76   Pulse 91   Temp 98.6 F (37 C) (Oral)   Resp 16   Ht 5\' 8"  (1.727 m)   Wt 83.9 kg   SpO2 97%   BMI 28.13  kg/m  Physical Exam Vitals and nursing note reviewed.  Constitutional:      General: He is not in acute distress.    Appearance: He is well-developed.  HENT:     Head: Normocephalic and atraumatic.  Eyes:     Conjunctiva/sclera: Conjunctivae normal.  Cardiovascular:     Rate and Rhythm: Normal rate and regular rhythm.     Heart sounds: No murmur heard. Pulmonary:     Effort: Pulmonary effort is normal. No respiratory distress.     Breath sounds: Normal breath sounds.  Abdominal:     Palpations: Abdomen is soft.     Tenderness: There is no abdominal tenderness.  Musculoskeletal:        General: No swelling.     Cervical back: Neck supple.     Comments: Right shoulder has no swelling, normal active range of motion, radial pulses 2+ on the right side.  No tenderness of the elbow or wrist.  Patient has tenderness to the  right trapezius area and right anterior shoulder.  This reproduces pain.  There is no swelling or overlying redness.  Skin:    General: Skin is warm and dry.     Capillary Refill: Capillary refill takes less than 2 seconds.  Neurological:     Mental Status: He is alert.  Psychiatric:        Mood and Affect: Mood normal.     ED Results / Procedures / Treatments   Labs (all labs ordered are listed, but only abnormal results are displayed) Labs Reviewed - No data to display  EKG None  Radiology DG Shoulder Right  Result Date: 04/04/2023 CLINICAL DATA:  Pain for 2.5 months, suspected injury while working out EXAM: RIGHT SHOULDER - 2+ VIEW COMPARISON:  None Available. FINDINGS: There is no evidence of fracture or dislocation. There is no evidence of arthropathy or other focal bone abnormality. Soft tissues are unremarkable. IMPRESSION: No fracture or dislocation of the right shoulder. Joint spaces are preserved. Electronically Signed   By: Jearld Lesch M.D.   On: 04/04/2023 18:16    Procedures Procedures    Medications Ordered in ED Medications  lidocaine (LIDODERM) 5 % 1 patch (has no administration in time range)  ketorolac (TORADOL) tablet 10 mg (has no administration in time range)    ED Course/ Medical Decision Making/ A&P                             Medical Decision Making DDx: Sprain, strain, dislocation, fracture, tendinitis, bursitis, cervical radiculopathy, other ED course: Patient has right shoulder pain after injury at lifting weights 2 and half months ago, he has not been lifting weights since then.  He has normal strength in the right upper extremity and normal range of motion.  He has tenderness along the right trapezius mostly but has some pain in the anterior shoulder and just inferior to the right collarbone.  He has no bony tenderness.  Just the patient is likely muscular pain, we can trial conservative management with muscle relaxers, NSAIDs, Lidoderm patch and  have him follow-up with PCP and/orthopedics, discussed he may need physical therapy and/or further imaging such as MRI if pain continues.  Given strict return precautions.  Amount and/or Complexity of Data Reviewed Radiology: ordered.  Risk Prescription drug management.           Final Clinical Impression(s) / ED Diagnoses Final diagnoses:  Acute pain of right shoulder  Rx / DC Orders ED Discharge Orders          Ordered    cyclobenzaprine (FLEXERIL) 10 MG tablet  2 times daily PRN        04/04/23 1835    naproxen (NAPROSYN) 500 MG tablet  2 times daily        04/04/23 1835    lidocaine (LIDODERM) 5 %  Every 24 hours        04/04/23 1835              Josem Kaufmann 04/04/23 1842    Terrilee Files, MD 04/05/23 [phone]

## 2023-04-04 NOTE — Discharge Instructions (Addendum)
In today for pain in your right shoulder for the past couple of months.  Your x-rays did not show any obvious injury.  It seems to be related to your muscles, though could also be related to your neck.  At this time we are going to treat you conservatively with medication to help with pain and muscle relaxers.  Follow-up closely with your primary care doctor and/or orthopedics.  Come back to the ER if you have new or worsening, worsening numbness, weakness, swelling, fevers or other worrisome changes

## 2023-04-04 NOTE — ED Triage Notes (Signed)
Pt c/o right shoulder pain x 2 months after he injured himself working out. Pain radiates from collar bone to joint and to shoulder blade, worst when moving arm. Pain rated 8/10 at worst. Taking advil at home with minimal relief.

## 2024-06-24 ENCOUNTER — Emergency Department (HOSPITAL_COMMUNITY)

## 2024-06-24 ENCOUNTER — Other Ambulatory Visit: Payer: Self-pay

## 2024-06-24 ENCOUNTER — Encounter (HOSPITAL_COMMUNITY): Payer: Self-pay | Admitting: Emergency Medicine

## 2024-06-24 ENCOUNTER — Emergency Department (HOSPITAL_COMMUNITY)
Admission: EM | Admit: 2024-06-24 | Discharge: 2024-06-24 | Disposition: A | Discharge status: Home / Self Care | Attending: Emergency Medicine | Admitting: Emergency Medicine

## 2024-06-24 DIAGNOSIS — M25511 Pain in right shoulder: Secondary | ICD-10-CM | POA: Insufficient documentation

## 2024-06-24 DIAGNOSIS — Z113 Encounter for screening for infections with a predominantly sexual mode of transmission: Secondary | ICD-10-CM | POA: Diagnosis not present

## 2024-06-24 DIAGNOSIS — T148XXA Other injury of unspecified body region, initial encounter: Secondary | ICD-10-CM

## 2024-06-24 DIAGNOSIS — M546 Pain in thoracic spine: Secondary | ICD-10-CM | POA: Insufficient documentation

## 2024-06-24 DIAGNOSIS — M549 Dorsalgia, unspecified: Secondary | ICD-10-CM | POA: Diagnosis not present

## 2024-06-24 MED ORDER — NAPROXEN 250 MG PO TABS
500.0000 mg | ORAL_TABLET | Freq: Once | ORAL | Status: AC
  Administered 2024-06-24: 500 mg via ORAL
  Filled 2024-06-24: qty 2

## 2024-06-24 MED ORDER — NAPROXEN 500 MG PO TABS: 500.0000 mg | ORAL_TABLET | Freq: Two times a day (BID) | ORAL | 0 refills | Status: AC

## 2024-06-24 MED ORDER — CEFTRIAXONE SODIUM 500 MG IJ SOLR
500.0000 mg | Freq: Once | INTRAMUSCULAR | Status: AC
  Administered 2024-06-24: 500 mg via INTRAMUSCULAR
  Filled 2024-06-24: qty 500

## 2024-06-24 MED ORDER — LIDOCAINE HCL (PF) 1 % IJ SOLN
INTRAMUSCULAR | Status: AC
  Filled 2024-06-24: qty 5

## 2024-06-24 MED ORDER — DOXYCYCLINE HYCLATE 100 MG PO CAPS: 100.0000 mg | ORAL_CAPSULE | Freq: Two times a day (BID) | ORAL | 0 refills | Status: AC

## 2024-06-24 MED ORDER — METHOCARBAMOL 750 MG PO TABS: 750.0000 mg | ORAL_TABLET | Freq: Three times a day (TID) | ORAL | 0 refills | Status: AC

## 2024-06-24 MED ORDER — METHOCARBAMOL 500 MG PO TABS
750.0000 mg | ORAL_TABLET | Freq: Once | ORAL | Status: AC
  Administered 2024-06-24: 750 mg via ORAL
  Filled 2024-06-24: qty 2

## 2024-06-24 NOTE — ED Provider Notes (Signed)
 [place] EMERGENCY DEPARTMENT AT [facility] Provider Note   CSN: [id number] Arrival date & time: 06/24/24  2033     Patient presents with: Back Pain   Maurice Williamson is a 38 y.o. male.   Patient is a 38 year old male who presents emergency department the chief complaint of pain to the right lateral aspect of his back which began earlier today after pulling on a tree which had failed.  Patient notes that he has had no recent falls or blunt trauma to his back.  He denies any numbness or paresthesias to bilateral upper and lower extremities with no associated urine or bowel incontinence or saddle paresthesias.  He denies any associated chest pain or shortness of breath.  He notes that he has had no pain to his neck or associated abnormal headaches.  Patient is also requesting testing for STIs at this point as he notes that his girlfriend did contact him noting that she was positive.  He currently denies any associated abdominal pain, dysuria, hematuria, penile discharge, testicular pain or tenderness or penile lesions.   Back Pain      Prior to Admission medications   Medication Sig Start Date End Date Taking? Authorizing Provider  acetaminophen  (TYLENOL ) 325 MG tablet Take 2 tablets (650 mg total) by mouth every 6 (six) hours as needed. 02/26/18   Mesner, Selinda, MD  cyclobenzaprine  (FLEXERIL ) 10 MG tablet Take 1 tablet (10 mg total) by mouth 2 (two) times daily as needed. 04/04/23   Suellen Cantor A, PA-C  dexamethasone  (DECADRON ) 4 MG tablet Take 1 tablet (4 mg total) by mouth 2 (two) times daily with a meal. Patient not taking: Reported on 05/21/2019 09/03/18   Armida Culver, PA-C  diclofenac  (VOLTAREN ) 50 MG EC tablet Take 1 tablet (50 mg total) by mouth 2 (two) times daily. 05/29/19   Bluford Rogue, MD  doxycycline  (VIBRAMYCIN ) 100 MG capsule Take 1 capsule (100 mg total) by mouth 2 (two) times daily. 03/20/22   Idol, Julie, PA-C  ibuprofen  (ADVIL ,MOTRIN ) 600 MG tablet Take 1  tablet (600 mg total) by mouth 4 (four) times daily. Patient not taking: Reported on 05/21/2019 09/03/18   Armida Culver, PA-C  lidocaine  (LIDODERM ) 5 % Place 1 patch onto the skin daily. Remove & Discard patch within 12 hours or as directed by MD 04/04/23   Suellen Cantor A, PA-C  methocarbamol  (ROBAXIN ) 500 MG tablet Take 1 tablet (500 mg total) by mouth 2 (two) times daily. 01/26/21   Shepard, Margaux, PA-C  naproxen  (NAPROSYN ) 500 MG tablet Take 1 tablet (500 mg total) by mouth 2 (two) times daily. 04/04/23   Suellen Cantor A, PA-C  predniSONE  (DELTASONE ) 10 MG tablet Take 6 tablets day one, 5 tablets day two, 4 tablets day three, 3 tablets day four, 2 tablets day five, then 1 tablet day six 05/21/19   Idol, Julie, PA-C    Allergies: Patient has no known allergies.    Review of Systems  Musculoskeletal:  Positive for back pain.  All other systems reviewed and are negative.   Updated Vital Signs BP (!) 104/56 (BP Location: Right Arm)   Pulse 76   Temp 98.9 F (37.2 C) (Oral)   Resp 18   Ht 5' 8 (1.727 m)   Wt 83.9 kg   SpO2 97%   BMI 28.13 kg/m   Physical Exam Vitals and nursing note reviewed.  Constitutional:      Appearance: Normal appearance.  HENT:     Head: Normocephalic  and atraumatic.  Eyes:     Extraocular Movements: Extraocular movements intact.     Conjunctiva/sclera: Conjunctivae normal.     Pupils: Pupils are equal, round, and reactive to light.  Cardiovascular:     Rate and Rhythm: Normal rate and regular rhythm.     Pulses: Normal pulses.     Heart sounds: Normal heart sounds. No murmur heard.    No gallop.  Pulmonary:     Effort: Pulmonary effort is normal. No respiratory distress.     Breath sounds: Normal breath sounds. No stridor. No wheezing, rhonchi or rales.  Abdominal:     General: Abdomen is flat. There is no distension.     Palpations: Abdomen is soft.     Tenderness: There is no abdominal tenderness. There is no guarding.  Musculoskeletal:         General: Normal range of motion.     Cervical back: Normal range of motion and neck supple. No rigidity or tenderness.     Comments: Tenderness palpation noted over the right lateral aspect of the back just over the trapezius muscle, no overlying erythema, warmth, bruising, nontender palpation remainder bilateral lower extremities, peripheral pulses 2+, sensation intact distally in bilateral upper extremities, nontender palpation of bilateral lower extremities, gait stable without assistance, no midline tenderness over thoracic or lumbar spine, no step-off or deformity  Skin:    General: Skin is warm and dry.     Findings: No bruising or rash.  Neurological:     General: No focal deficit present.     Mental Status: He is alert and oriented to person, place, and time. Mental status is at baseline.  Psychiatric:        Mood and Affect: Mood normal.        Behavior: Behavior normal.        Thought Content: Thought content normal.        Judgment: Judgment normal.     (all labs ordered are listed, but only abnormal results are displayed) Labs Reviewed  GC/CHLAMYDIA PROBE AMP ([place]) NOT AT [facility]    EKG: None  Radiology: DG Thoracic Spine 2 View Result Date: 06/24/2024 CLINICAL DATA:  Upper back pain. EXAM: THORACIC SPINE 2 VIEWS COMPARISON:  Chest CT 09/03/2018 FINDINGS: Normal alignment of the thoracic vertebral bodies. Disc spaces and vertebral bodies are maintained. No acute compression fracture. No abnormal paraspinal soft tissue thickening. The visualized posterior ribs are intact. IMPRESSION: Normal alignment and no acute bony findings. Electronically Signed   By: MYRTIS Stammer M.D.   On: 06/24/2024 22:26     Procedures   Medications Ordered in the ED  cefTRIAXone  (ROCEPHIN ) injection 500 mg (has no administration in time range)  naproxen  (NAPROSYN ) tablet 500 mg (has no administration in time range)  methocarbamol  (ROBAXIN ) tablet 750 mg (has no administration in time  range)                                    Medical Decision Making Patient is doing well at this time and is stable for discharge home.  Discussed with patient that we will treat him for muscle spasm at this point as he is tender diffusely across the right trapezius muscle.  Do not suspect underlying etiologies such as cauda equina syndrome, vertebral osteomyelitis, epidural abscess.  X-rays were unremarkable of the thoracic spine.  Patient denies any chest pain or shortness of breath and  do not suspect underlying cardiac or pulmonary cause of his pain.  It is completely reproducible with palpation and movement.  Patient has no pain to his neck and no associated abnormal headaches.  No clinical indication byline etiology such as meningitis.  Patient has no concerning neurological deficits at this point do not suspect any further advanced imaging is warranted.  Patient is requesting treatment for STIs at this time.  Will send urine for gonorrhea and chlamydia.  Patient is otherwise asymptomatic at this point with no associated penile lesions, dysuria, hematuria, penile discharge.  He has no testicular pain.  Strict return precautions were discussed for any new or worsening symptoms.  Patient voiced understanding and had no additional questions.  Amount and/or Complexity of Data Reviewed Radiology: ordered.  Risk Prescription drug management.        Final diagnoses:  None    ED Discharge Orders     None          Daralene Lonni JONETTA DEVONNA 06/24/24 2237    Suzette Pac, MD 06/27/24 1113

## 2024-06-24 NOTE — Discharge Instructions (Signed)
 Please take all antibiotics as directed.  Follow-up closely with your primary care doctor on an outpatient basis.  Return to emergency department immediately for any new or worsening symptoms.

## 2024-06-24 NOTE — ED Triage Notes (Signed)
 Pt to the ED with complaints upper back pain after pulling on a tree today.

## 2024-06-26 LAB — GC/CHLAMYDIA PROBE AMP (~~LOC~~) NOT AT ARMC
Chlamydia: NEGATIVE
Comment: NEGATIVE
Comment: NORMAL
Neisseria Gonorrhea: NEGATIVE

## 2024-09-06 ENCOUNTER — Other Ambulatory Visit: Payer: Self-pay

## 2024-09-06 ENCOUNTER — Emergency Department (HOSPITAL_COMMUNITY)
Admission: EM | Admit: 2024-09-06 | Discharge: 2024-09-06 | Disposition: A | Discharge status: Home / Self Care | Attending: Emergency Medicine | Admitting: Emergency Medicine

## 2024-09-06 ENCOUNTER — Encounter (HOSPITAL_COMMUNITY): Payer: Self-pay

## 2024-09-06 DIAGNOSIS — K644 Residual hemorrhoidal skin tags: Secondary | ICD-10-CM | POA: Diagnosis not present

## 2024-09-06 DIAGNOSIS — K6289 Other specified diseases of anus and rectum: Secondary | ICD-10-CM | POA: Diagnosis present

## 2024-09-06 DIAGNOSIS — K625 Hemorrhage of anus and rectum: Secondary | ICD-10-CM | POA: Diagnosis not present

## 2024-09-06 LAB — CBC
HCT: 40.4 % (ref 39.0–52.0)
Hemoglobin: 13 g/dL (ref 13.0–17.0)
MCH: 29.3 pg (ref 26.0–34.0)
MCHC: 32.2 g/dL (ref 30.0–36.0)
MCV: 91.2 fL (ref 80.0–100.0)
Platelets: 250 K/uL (ref 150–400)
RBC: 4.43 MIL/uL (ref 4.22–5.81)
RDW: 12.9 % (ref 11.5–15.5)
WBC: 4.4 K/uL (ref 4.0–10.5)
nRBC: 0 % (ref 0.0–0.2)

## 2024-09-06 MED ORDER — HYDROCORTISONE ACETATE 25 MG RE SUPP: 25.0000 mg | Freq: Two times a day (BID) | RECTAL | 0 refills | Status: AC

## 2024-09-06 NOTE — Discharge Instructions (Addendum)
 You were seen in the ER today for concern of hemorrhoids.  I do not see any obvious hemorrhoids on exam, though you could have internal hemorrhoids that I could not see, we can treat you with Anusol suppositories which should help with the pain and inflammation.  You can take MiraLAX 1 capful once daily to help keep your bowel movements soft and prevent straining which can worsen hemorrhoids.  You should follow-up closely with gastroenterology for further evaluation.  Rechecked your blood count which was normal.  Come back to the ER if you have new or worsening symptoms such as severe pain, heavy bleeding, fever, vomiting or other worrisome symptoms.

## 2024-09-06 NOTE — ED Triage Notes (Signed)
 Pt arrives with c/o hemorrhoids. Pt reports pain with BMs, bleeding with BMs, and swelling.

## 2024-09-06 NOTE — ED Provider Notes (Signed)
 [facility] Provider Note   Maurice Williamson: [id number] Arrival date & time: 09/06/24  1212     Patient presents with: Hemorrhoids   M[facility] is [facility] 38 y.o. male.  [facility] has history of pericarditis and carpal tunnel syndrome.  Presents to the ER today complaining of hemorrhoids.  M[facility]ice Williamson states been going on for [facility] long time lately, but has been flaring up the past week.  M[facility] states when [facility] has [facility] bowel movement [facility] has pain, feels hemorrhoids bulging out and has some blood in the toilet and with wiping, though states this is [facility] very small amount.  [facility] denies abdominal pain, nausea or vomiting, weakness.  No chest pain, no shortness of breath.  Denies any black or bloody stools.  M[facility]ice Williamson has never seen GI for this.  [facility] does not take stool softeners.   HPI     Prior to Admission medications   Medication Sig Start Date End Date Taking? Authorizing Provider  acetaminophen  (TYLENOL ) 325 MG tablet Take 2 tablets (650 mg total) by mouth every 6 (six) hours as needed. 02/26/18   [facility], Selinda, [facility]  cyclobenzaprine  (FLEXERIL ) 10 MG tablet Take 1 tablet (10 mg total) by mouth 2 (two) times daily as needed. 04/04/23   [facility], [facility]  dexamethasone  (DECADRON ) 4 MG tablet Take 1 tablet (4 mg total) by mouth 2 (two) times daily with [facility] meal. Patient not taking: Reported on 05/21/2019 09/03/18   Maurice Culver, [facility]  diclofenac  (VOLTAREN ) 50 MG EC tablet Take 1 tablet (50 mg total) by mouth 2 (two) times daily. 05/29/19   Maurice Rogue, [facility]  [facility]  (M[facility] ,[facility] ) 600 MG tablet Take 1 tablet (600 mg total) by mouth 4 (four) times daily. Patient not taking: Reported on 05/21/2019 09/03/18   Maurice Culver, [facility]  lidocaine  ([facility] ) 5 % Place 1 patch onto the skin daily. Remove & Discard patch within 12 hours or as directed by [facility] 04/04/23   [facility] Williamson [facility], [facility]  M[facility]  (M[facility] ) 750 MG tablet Take 1 tablet (750 mg total) by mouth 3 (three) times daily. 06/24/24   Maurice Lonni BIRCH, [facility]  M[facility]  (M[facility] ) 500 MG tablet Take 1 tablet (500 mg total) by mouth 2 (two) times daily. 06/24/24   Maurice Lonni BIRCH, [facility]  [facility]  (M[facility] Williamson ) 10 MG tablet Take 6 tablets day one, 5 tablets day two, 4 tablets day three, 3 tablets day four, 2 tablets day five, then 1 tablet day six 05/21/19   [facility], Julie, [facility]    Allergies: Patient has no known allergies.    Review of Systems  Updated Vital Signs BP 131/81 (BP Location: Right Arm)   Pulse 77   Temp 99.1 F (37.3 C) (Oral)   Resp 16   SpO2 98%   Physical Exam Vitals and nursing note reviewed.  Constitutional:      General: [facility] is not in acute distress.    Appearance: [facility] is well-developed.  HENT:     Head: Normocephalic and atraumatic.  Eyes:     Conjunctiva/sclera: Conjunctivae normal.  Cardiovascular:     Rate and Rhythm: Normal rate and regular rhythm.     Heart sounds: No murmur heard. Pulmonary:     Effort: Pulmonary effort is normal. No respiratory distress.     Breath sounds: Normal breath sounds.  Abdominal:     Palpations: Abdomen is soft.     Tenderness: There is no abdominal tenderness.  Genitourinary:    Comments: Skin tag  at 12 o'clock position, nontender, rounded soft tissue growth noted at 9 o'clock position of rectum.  Chaperoned by [facility]. DRE negative for tenderness, or frank blood, no stool in the rectum Musculoskeletal:        General: No swelling.     Cervical back: Neck supple.  Skin:    General: Skin is warm and dry.     Capillary Refill: Capillary refill takes less than 2 seconds.  Neurological:     General: No focal deficit present.     Mental Status: [facility] is alert and oriented to person, place, and time.  Psychiatric:        Mood and Affect: Mood normal.     (all labs ordered are listed, but only abnormal results are displayed) Labs Reviewed  CBC    EKG: None  Radiology: No results found.   Procedures   Medications Ordered in the ED - No data to  display                                  Medical Decision Making This patient presents to the ED for concern of hemorrhoids, this involves an extensive number of treatment options, and is [facility] complaint that carries with it [facility] high risk of complications and morbidity.  The differential diagnosis includes hemorrhoids, anal fissure, rectal mass, constipation, other     Additional history obtained:  Additional history obtained from EMR External records from outside source obtained and reviewed including prior notes, labs   Lab Tests:  I Ordered, and personally interpreted labs.  The pertinent results include:  cbc normal      Problem List / ED Course / Critical interventions / Medication management  Patient complaining of rectal pain and bleeding felt to be due to hemorrhoids.  [facility] states an ongoing problem has been worse recently.  On exam I do not see any prolapse hemorrhoids do not palpate any significant internal hemorrhoids.  [facility] has no rectal tenderness at this time, no gross blood on rectal exam, denies black or bloody stools, I did check CBC and his hemoglobin is normal.  Discussed with patient we cannot prescribed Anusol suppositories at this time and have him follow-up closely with GI.  [facility] is agreeable with plan of care and discharge and was given strict return precautions.  I have reviewed the patients home medicines and have made adjustments as needed      Amount and/or Complexity of Data Reviewed Labs: ordered.  Risk Prescription drug management.        Final diagnoses:  None    ED Discharge Orders     None          Maurice Sherran LABOR, [facility] 09/06/24 1443    Elnor Jayson LABOR, DO 09/11/24 1740
